# Patient Record
Sex: Female | Born: 1970 | Race: Black or African American | Hispanic: No | Marital: Married | Smoking: Never smoker
Health system: Southern US, Community
[De-identification: ages and names within clinical notes are randomized; demographics above are authoritative.]

## PROBLEM LIST (undated history)

## (undated) DIAGNOSIS — Z789 Other specified health status: Secondary | ICD-10-CM

## (undated) HISTORY — PX: NO PAST SURGERIES: SHX2092

---

## 2007-03-08 ENCOUNTER — Other Ambulatory Visit: Admission: RE | Admit: 2007-03-08 | Discharge: 2007-03-08 | Payer: Self-pay | Admitting: Obstetrics and Gynecology

## 2010-11-09 ENCOUNTER — Emergency Department (HOSPITAL_COMMUNITY)
Admission: EM | Admit: 2010-11-09 | Discharge: 2010-11-10 | Disposition: A | Discharge status: Home / Self Care | Payer: Self-pay | Attending: Emergency Medicine | Admitting: Emergency Medicine

## 2010-11-09 DIAGNOSIS — R110 Nausea: Secondary | ICD-10-CM | POA: Insufficient documentation

## 2010-11-09 DIAGNOSIS — K219 Gastro-esophageal reflux disease without esophagitis: Secondary | ICD-10-CM | POA: Insufficient documentation

## 2010-11-09 DIAGNOSIS — R1013 Epigastric pain: Secondary | ICD-10-CM | POA: Insufficient documentation

## 2010-11-09 DIAGNOSIS — I10 Essential (primary) hypertension: Secondary | ICD-10-CM | POA: Insufficient documentation

## 2010-11-10 LAB — POCT I-STAT, CHEM 8
Chloride: 106 mEq/L (ref 96–112)
HCT: 39 % (ref 36.0–46.0)
Potassium: 3.9 mEq/L (ref 3.5–5.1)
Sodium: 141 mEq/L (ref 135–145)

## 2010-11-10 LAB — URINALYSIS, ROUTINE W REFLEX MICROSCOPIC
Nitrite: NEGATIVE
Specific Gravity, Urine: 1.008 (ref 1.005–1.030)
pH: 6.5 (ref 5.0–8.0)

## 2010-11-10 LAB — POCT PREGNANCY, URINE: Preg Test, Ur: NEGATIVE

## 2012-06-08 NOTE — L&D Delivery Note (Signed)
Delivery Note At 4:33 PM a viable female was delivered via Vaginal, Spontaneous Delivery (Presentation: Occiput Anterior).  APGAR: 9, 9 ; weight pending.  Infant placed directly on mother's chest. Placenta status: intact, spontaneous.  Cord: 3 vessels with no complications.  Cord pH: not sent. Mother and infant bonding.  Anesthesia: None  Episiotomy: None Lacerations: 1st Degree  Suture Repair: 3.0 monocryl Est. Blood Loss (mL): 400  Mom to postpartum.  Baby to nursery-stable.  Selena Lesser 04/04/2013, 4:50 PM

## 2012-06-08 NOTE — L&D Delivery Note (Signed)
I was present for delivery and agree with note above. MUHAMMAD,Davinia Riccardi  

## 2012-12-21 ENCOUNTER — Other Ambulatory Visit (HOSPITAL_COMMUNITY)
Admission: RE | Admit: 2012-12-21 | Discharge: 2012-12-21 | Disposition: A | Discharge status: Home / Self Care | Payer: Medicaid Other | Source: Ambulatory Visit | Attending: Obstetrics and Gynecology | Admitting: Obstetrics and Gynecology

## 2012-12-21 ENCOUNTER — Encounter: Payer: Self-pay | Admitting: Obstetrics and Gynecology

## 2012-12-21 ENCOUNTER — Ambulatory Visit (INDEPENDENT_AMBULATORY_CARE_PROVIDER_SITE_OTHER): Payer: Medicaid Other | Admitting: Obstetrics and Gynecology

## 2012-12-21 VITALS — BP 100/68 | Temp 97.1°F | Ht 63.0 in | Wt 148.2 lb

## 2012-12-21 DIAGNOSIS — Z113 Encounter for screening for infections with a predominantly sexual mode of transmission: Secondary | ICD-10-CM | POA: Insufficient documentation

## 2012-12-21 DIAGNOSIS — Z3482 Encounter for supervision of other normal pregnancy, second trimester: Secondary | ICD-10-CM

## 2012-12-21 DIAGNOSIS — O0932 Supervision of pregnancy with insufficient antenatal care, second trimester: Secondary | ICD-10-CM

## 2012-12-21 DIAGNOSIS — Z01419 Encounter for gynecological examination (general) (routine) without abnormal findings: Secondary | ICD-10-CM | POA: Insufficient documentation

## 2012-12-21 DIAGNOSIS — O0930 Supervision of pregnancy with insufficient antenatal care, unspecified trimester: Secondary | ICD-10-CM

## 2012-12-21 DIAGNOSIS — Z1151 Encounter for screening for human papillomavirus (HPV): Secondary | ICD-10-CM | POA: Insufficient documentation

## 2012-12-21 DIAGNOSIS — Z3480 Encounter for supervision of other normal pregnancy, unspecified trimester: Secondary | ICD-10-CM | POA: Insufficient documentation

## 2012-12-21 LAB — POCT URINALYSIS DIP (DEVICE)
Bilirubin Urine: NEGATIVE
Glucose, UA: NEGATIVE mg/dL
Leukocytes, UA: NEGATIVE
Nitrite: NEGATIVE
Urobilinogen, UA: 0.2 mg/dL (ref 0.0–1.0)

## 2012-12-21 NOTE — Assessment & Plan Note (Signed)
See Initial OB Visit Note for details.

## 2012-12-21 NOTE — Progress Notes (Signed)
Pulse- 89 Patient reports occasional vaginal itching; patient not 100 % sure about LMP but knows it was sometime in January

## 2012-12-21 NOTE — Progress Notes (Signed)
Evaluation and management procedures were performed by Resident physician under my supervision/collaboration. Chart reviewed, patient examined by me and I agree with management and plan.  

## 2012-12-21 NOTE — Progress Notes (Addendum)
   Subjective:    Rachael Gardner is a [id number] [redacted]w[redacted]d being seen today for her first obstetrical visit, without prior prenatal care. She has taken a Prenatal MVI since she tested + for pregnancy.  Her obstetrical history is not significant for any reported complications. Patient does intend to breast feed. Pregnancy history fully reviewed.  Patient reports no complaints.  Filed Vitals:   12/21/12 0827 12/21/12 0829  BP: 100/68   Temp: 97.1 F (36.2 C)   Height:  5\' 3"  (1.6 m)  Weight: 148 lb 3.2 oz (67.223 kg)     HISTORY: OB History   Grav Para Term Preterm Abortions TAB SAB Ect Mult Living   5 4 4  0 0 0 0 0 0 4     # Outc Date GA Lbr Len/2nd Wgt Sex Del Anes PTL Lv   1 TRM 3/91 [redacted]w[redacted]d  7lb(3.175kg) M SVD   Yes   2 TRM 8/93 [redacted]w[redacted]d  9lb(4.082kg) M SVD   Yes   3 TRM 9/96 [redacted]w[redacted]d  7lb(3.175kg) M SVD None  Yes   4 TRM 9/99 [redacted]w[redacted]d  4lb(1.814kg) F SVD None  Yes   5 CUR              History reviewed. No pertinent past medical history. History reviewed. No pertinent past surgical history. History reviewed. No pertinent family history.   Exam    Uterus:     Pelvic Exam:    Perineum: Normal Perineum   Vulva: normal   Vagina:  normal discharge, wet prep done   pH:    Cervix: no bleeding following Pap, no cervical motion tenderness and no lesions   Adnexa: not evaluated      System: Breast:  normal appearance, no masses or tenderness, Inspection negative   Skin: normal coloration and turgor, no rashes    Neurologic: oriented, gait normal; reflexes normal and symmetric, grossly non-focal   Extremities: normal strength, tone, and muscle mass, no erythema, induration, or nodules   HEENT PERRLA and oropharynx clear, no lesions   Mouth/Teeth mucous membranes moist, pharynx normal without lesions   Neck Supple, no masses, no LAD   Cardiovascular: regular rate and rhythm   Respiratory:  chest clear, no wheezing, crepitations, rhonchi, normal symmetric air entry   Abdomen: soft,  non-tender; bowel sounds normal; no masses,  no organomegaly, normal pregnant abd   Urinary: urethral meatus normal      Assessment:    Pregnancy: [id number] Patient Active Problem List   Diagnosis Date Noted  . Supervision of normal subsequent pregnancy 12/21/2012        Plan:     Initial labs drawn. Continues to take Prenatal vitamins. Problem list reviewed and updated. Presented too late for genetic screening.  No prior US, Ultrasound discussed will order for next apt.  Follow up in 4 weeks. 50% of 30 min visit spent on counseling and coordination of care.    Saralyn Pilar 12/21/2012  Evaluation and management procedures were performed by Resident physician under my supervision/collaboration. Chart reviewed, patient examined by me and I agree with management and plan.

## 2012-12-22 LAB — OBSTETRIC PANEL
Basophils Relative: 0 % (ref 0–1)
Eosinophils Absolute: 0 10*3/uL (ref 0.0–0.7)
Hemoglobin: 10.9 g/dL — ABNORMAL LOW (ref 12.0–15.0)
Hepatitis B Surface Ag: NEGATIVE
Lymphs Abs: 1.1 10*3/uL (ref 0.7–4.0)
MCH: 30.4 pg (ref 26.0–34.0)
MCHC: 33.7 g/dL (ref 30.0–36.0)
Monocytes Relative: 6 % (ref 3–12)
Neutro Abs: 5 10*3/uL (ref 1.7–7.7)
Neutrophils Relative %: 77 % (ref 43–77)
Platelets: 169 10*3/uL (ref 150–400)
RBC: 3.58 MIL/uL — ABNORMAL LOW (ref 3.87–5.11)
Rh Type: POSITIVE

## 2012-12-22 LAB — CULTURE, OB URINE: Colony Count: NO GROWTH

## 2012-12-26 LAB — HEMOGLOBINOPATHY EVALUATION
Hemoglobin Other: 0 %
Hgb A2 Quant: 2.9 % (ref 2.2–3.2)
Hgb A: 97.1 % (ref 96.8–97.8)
Hgb S Quant: 0 %

## 2013-01-04 ENCOUNTER — Ambulatory Visit (INDEPENDENT_AMBULATORY_CARE_PROVIDER_SITE_OTHER): Payer: Medicaid Other | Admitting: Family

## 2013-01-04 VITALS — BP 108/74 | Temp 98.8°F | Wt 147.8 lb

## 2013-01-04 DIAGNOSIS — Z3480 Encounter for supervision of other normal pregnancy, unspecified trimester: Secondary | ICD-10-CM

## 2013-01-04 DIAGNOSIS — O0930 Supervision of pregnancy with insufficient antenatal care, unspecified trimester: Secondary | ICD-10-CM

## 2013-01-04 LAB — POCT URINALYSIS DIP (DEVICE)
Hgb urine dipstick: NEGATIVE
Nitrite: NEGATIVE
Protein, ur: 30 mg/dL — AB
Specific Gravity, Urine: >=1.03 (ref 1.005–1.030)
Urobilinogen, UA: 0.2 mg/dL (ref 0.0–1.0)
pH: 5.5 (ref 5.0–8.0)

## 2013-01-04 NOTE — Progress Notes (Signed)
No concerns today. Presented late for prenatal care, no genetic screen or prior US. Denies vaginal bleeding / discharge / fluid leakage. No burning on urination, nausea, or pains / contractions. +Fetal movement daily.  Had early 1hr OGT normal. No need to repeat. UA shows Protein 30 (1+), BP normal, no edema, or prior elevated Uprotein. Will follow at next visit. Schedule for initial anatomy US before next apt Return for follow-up for pregnancy supervision in 2 weeks.

## 2013-01-04 NOTE — Patient Instructions (Signed)
Pregnancy - Second Trimester The second trimester of pregnancy (3 to 6 months) is a period of rapid growth for you and your baby. At the end of the sixth month, your baby is about 9 inches long and weighs 1 1/2 pounds. You will begin to feel the baby move between 18 and 20 weeks of the pregnancy. This is called quickening. Weight gain is faster. A clear fluid (colostrum) may leak out of your breasts. You may feel small contractions of the womb (uterus). This is known as false labor or Braxton-Hicks contractions. This is like a practice for labor when the baby is ready to be born. Usually, the problems with morning sickness have usually passed by the end of your first trimester. Some women develop small dark blotches (called cholasma, mask of pregnancy) on their face that usually goes away after the baby is born. Exposure to the sun makes the blotches worse. Acne may also develop in some pregnant women and pregnant women who have acne, may find that it goes away. PRENATAL EXAMS  Blood work may continue to be done during prenatal exams. These tests are done to check on your health and the probable health of your baby. Blood work is used to follow your blood levels (hemoglobin). Anemia (low hemoglobin) is common during pregnancy. Iron and vitamins are given to help prevent this. You will also be checked for diabetes between 24 and 28 weeks of the pregnancy. Some of the previous blood tests may be repeated.  The size of the uterus is measured during each visit. This is to make sure that the baby is continuing to grow properly according to the dates of the pregnancy.  Your blood pressure is checked every prenatal visit. This is to make sure you are not getting toxemia.  Your urine is checked to make sure you do not have an infection, diabetes or protein in the urine.  Your weight is checked often to make sure gains are happening at the suggested rate. This is to ensure that both you and your baby are  growing normally.  Sometimes, an ultrasound is performed to confirm the proper growth and development of the baby. This is a test which bounces harmless sound waves off the baby so your caregiver can more accurately determine due dates. Sometimes, a test is done on the amniotic fluid surrounding the baby. This test is called an amniocentesis. The amniotic fluid is obtained by sticking a needle into the belly (abdomen). This is done to check the chromosomes in instances where there is a concern about possible genetic problems with the baby. It is also sometimes done near the end of pregnancy if an early delivery is required. In this case, it is done to help make sure the baby's lungs are mature enough for the baby to live outside of the womb. CHANGES OCCURING IN THE SECOND TRIMESTER OF PREGNANCY Your body goes through many changes during pregnancy. They vary from person to person. Talk to your caregiver about changes you notice that you are concerned about.  During the second trimester, you will likely have an increase in your appetite. It is normal to have cravings for certain foods. This varies from person to person and pregnancy to pregnancy.  Your lower abdomen will begin to bulge.  You may have to urinate more often because the uterus and baby are pressing on your bladder. It is also common to get more bladder infections during pregnancy. You can help this by drinking lots of fluids   and emptying your bladder before and after intercourse.  You may begin to get stretch marks on your hips, abdomen, and breasts. These are normal changes in the body during pregnancy. There are no exercises or medicines to take that prevent this change.  You may begin to develop swollen and bulging veins (varicose veins) in your legs. Wearing support hose, elevating your feet for 15 minutes, 3 to 4 times a day and limiting salt in your diet helps lessen the problem.  Heartburn may develop as the uterus grows and  pushes up against the stomach. Antacids recommended by your caregiver helps with this problem. Also, eating smaller meals 4 to 5 times a day helps.  Constipation can be treated with a stool softener or adding bulk to your diet. Drinking lots of fluids, and eating vegetables, fruits, and whole grains are helpful.  Exercising is also helpful. If you have been very active up until your pregnancy, most of these activities can be continued during your pregnancy. If you have been less active, it is helpful to start an exercise program such as walking.  Hemorrhoids may develop at the end of the second trimester. Warm sitz baths and hemorrhoid cream recommended by your caregiver helps hemorrhoid problems.  Backaches may develop during this time of your pregnancy. Avoid heavy lifting, wear low heal shoes, and practice good posture to help with backache problems.  Some pregnant women develop tingling and numbness of their hand and fingers because of swelling and tightening of ligaments in the wrist (carpel tunnel syndrome). This goes away after the baby is born.  As your breasts enlarge, you may have to get a bigger bra. Get a comfortable, cotton, support bra. Do not get a nursing bra until the last month of the pregnancy if you will be nursing the baby.  You may get a dark line from your belly button to the pubic area called the linea nigra.  You may develop rosy cheeks because of increase blood flow to the face.  You may develop spider looking lines of the face, neck, arms, and chest. These go away after the baby is born. HOME CARE INSTRUCTIONS   It is extremely important to avoid all smoking, herbs, alcohol, and unprescribed drugs during your pregnancy. These chemicals affect the formation and growth of the baby. Avoid these chemicals throughout the pregnancy to ensure the delivery of a healthy infant.  Most of your home care instructions are the same as suggested for the first trimester of your  pregnancy. Keep your caregiver's appointments. Follow your caregiver's instructions regarding medicine use, exercise, and diet.  During pregnancy, you are providing food for you and your baby. Continue to eat regular, well-balanced meals. Choose foods such as meat, fish, milk and other low fat dairy products, vegetables, fruits, and whole-grain breads and cereals. Your caregiver will tell you of the ideal weight gain.  A physical sexual relationship may be continued up until near the end of pregnancy if there are no other problems. Problems could include early (premature) leaking of amniotic fluid from the membranes, vaginal bleeding, abdominal pain, or other medical or pregnancy problems.  Exercise regularly if there are no restrictions. Check with your caregiver if you are unsure of the safety of some of your exercises. The greatest weight gain will occur in the last 2 trimesters of pregnancy. Exercise will help you:  Control your weight.  Get you in shape for labor and delivery.  Lose weight after you have the baby.  Wear   a good support or jogging bra for breast tenderness during pregnancy. This may help if worn during sleep. Pads or tissues may be used in the bra if you are leaking colostrum.  Do not use hot tubs, steam rooms or saunas throughout the pregnancy.  Wear your seat belt at all times when driving. This protects you and your baby if you are in an accident.  Avoid raw meat, uncooked cheese, cat litter boxes, and soil used by cats. These carry germs that can cause birth defects in the baby.  The second trimester is also a good time to visit your dentist for your dental health if this has not been done yet. Getting your teeth cleaned is okay. Use a soft toothbrush. Brush gently during pregnancy.  It is easier to leak urine during pregnancy. Tightening up and strengthening the pelvic muscles will help with this problem. Practice stopping your urination while you are going to the  bathroom. These are the same muscles you need to strengthen. It is also the muscles you would use as if you were trying to stop from passing gas. You can practice tightening these muscles up 10 times a set and repeating this about 3 times per day. Once you know what muscles to tighten up, do not perform these exercises during urination. It is more likely to contribute to an infection by backing up the urine.  Ask for help if you have financial, counseling, or nutritional needs during pregnancy. Your caregiver will be able to offer counseling for these needs as well as refer you for other special needs.  Your skin may become oily. If so, wash your face with mild soap, use non-greasy moisturizer and oil or cream based makeup. MEDICINES AND DRUG USE IN PREGNANCY  Take prenatal vitamins as directed. The vitamin should contain 1 milligram of folic acid. Keep all vitamins out of reach of children. Only a couple vitamins or tablets containing iron may be fatal to a baby or young child when ingested.  Avoid use of all medicines, including herbs, over-the-counter medicines, not prescribed or suggested by your caregiver. Only take over-the-counter or prescription medicines for pain, discomfort, or fever as directed by your caregiver. Do not use aspirin.  Let your caregiver also know about herbs you may be using.  Alcohol is related to a number of birth defects. This includes fetal alcohol syndrome. All alcohol, in any form, should be avoided completely. Smoking will cause low birth rate and premature babies.  Street or illegal drugs are very harmful to the baby. They are absolutely forbidden. A baby born to an addicted mother will be addicted at birth. The baby will go through the same withdrawal an adult does. SEEK MEDICAL CARE IF:  You have any concerns or worries during your pregnancy. It is better to call with your questions if you feel they cannot wait, rather than worry about them. SEEK IMMEDIATE  MEDICAL CARE IF:   An unexplained oral temperature above 102 F (38.9 C) develops, or as your caregiver suggests.  You have leaking of fluid from the vagina (birth canal). If leaking membranes are suspected, take your temperature and tell your caregiver of this when you call.  There is vaginal spotting, bleeding, or passing clots. Tell your caregiver of the amount and how many pads are used. Light spotting in pregnancy is common, especially following intercourse.  You develop a bad smelling vaginal discharge with a change in the color from clear to white.  You continue to feel   sick to your stomach (nauseated) and have no relief from remedies suggested. You vomit blood or coffee ground-like materials.  You lose more than 2 pounds of weight or gain more than 2 pounds of weight over 1 week, or as suggested by your caregiver.  You notice swelling of your face, hands, feet, or legs.  You get exposed to German measles and have never had them.  You are exposed to fifth disease or chickenpox.  You develop belly (abdominal) pain. Round ligament discomfort is a common non-cancerous (benign) cause of abdominal pain in pregnancy. Your caregiver still must evaluate you.  You develop a bad headache that does not go away.  You develop fever, diarrhea, pain with urination, or shortness of breath.  You develop visual problems, blurry, or double vision.  You fall or are in a car accident or any kind of trauma.  There is mental or physical violence at home. Document Released: 05/19/2001 Document Revised: 02/17/2012 Document Reviewed: 11/21/2008 [employer] Patient Information 2014 [employer].  

## 2013-01-04 NOTE — Assessment & Plan Note (Signed)
See Prenatal Vitals and Notes for details. 

## 2013-01-04 NOTE — Progress Notes (Signed)
Pulse- 101 

## 2013-01-10 ENCOUNTER — Ambulatory Visit (HOSPITAL_COMMUNITY)
Admission: RE | Admit: 2013-01-10 | Discharge: 2013-01-10 | Disposition: A | Discharge status: Home / Self Care | Payer: No Typology Code available for payment source | Source: Ambulatory Visit | Attending: Obstetrics and Gynecology | Admitting: Obstetrics and Gynecology

## 2013-01-10 DIAGNOSIS — O0932 Supervision of pregnancy with insufficient antenatal care, second trimester: Secondary | ICD-10-CM

## 2013-01-10 DIAGNOSIS — O09529 Supervision of elderly multigravida, unspecified trimester: Secondary | ICD-10-CM | POA: Insufficient documentation

## 2013-01-10 DIAGNOSIS — O0930 Supervision of pregnancy with insufficient antenatal care, unspecified trimester: Secondary | ICD-10-CM | POA: Insufficient documentation

## 2013-01-10 DIAGNOSIS — Z3482 Encounter for supervision of other normal pregnancy, second trimester: Secondary | ICD-10-CM

## 2013-01-10 DIAGNOSIS — Z3689 Encounter for other specified antenatal screening: Secondary | ICD-10-CM | POA: Insufficient documentation

## 2013-01-18 ENCOUNTER — Ambulatory Visit (INDEPENDENT_AMBULATORY_CARE_PROVIDER_SITE_OTHER): Payer: Medicaid Other | Admitting: Family Medicine

## 2013-01-18 ENCOUNTER — Encounter: Payer: Self-pay | Admitting: Family Medicine

## 2013-01-18 VITALS — BP 103/62 | Temp 97.1°F | Wt 152.0 lb

## 2013-01-18 DIAGNOSIS — O0932 Supervision of pregnancy with insufficient antenatal care, second trimester: Secondary | ICD-10-CM

## 2013-01-18 DIAGNOSIS — O0930 Supervision of pregnancy with insufficient antenatal care, unspecified trimester: Secondary | ICD-10-CM

## 2013-01-18 DIAGNOSIS — Z3483 Encounter for supervision of other normal pregnancy, third trimester: Secondary | ICD-10-CM

## 2013-01-18 LAB — POCT URINALYSIS DIP (DEVICE)
Hgb urine dipstick: NEGATIVE
Ketones, ur: NEGATIVE mg/dL
Protein, ur: NEGATIVE mg/dL
Specific Gravity, Urine: 1.02 (ref 1.005–1.030)
pH: 7 (ref 5.0–8.0)

## 2013-01-18 NOTE — Progress Notes (Signed)
  Subjective:    Rachael Gardner is a 42 y.o. female being seen today for her obstetrical visit. She is at [redacted]w[redacted]d gestation. Patient reports no bleeding, no contractions, no cramping and no leaking. Fetal movement: normal.  Menstrual History: OB History   Grav Para Term Preterm Abortions TAB SAB Ect Mult Living   5 4 4  0 0 0 0 0 0 4      Patient's last menstrual period was 06/17/2012.    The following portions of the patient's history were reviewed and updated as appropriate: allergies, current medications, past family history, past medical history, past social history, past surgical history and problem list.  Review of Systems Pertinent items are noted in HPI.   Objective:    BP 103/62  Temp(Src) 97.1 F (36.2 C)  Wt 152 lb (68.947 kg)  BMI 26.93 kg/m2  LMP 06/17/2012 FHT:  154 BPM  Uterine Size: 28 cm  Presentation: unsure     Assessment:  Rachael Gardner is a 42 y.o. G5P4004 at [redacted]w[redacted]d ROB Plan:   Rachael Gardner is a 42 y.o. [id number] at [redacted]w[redacted]d  here for ROB visit.  Discussed with Patient:  -Plans to breast feed.  All questions answered. -Continue prenatal vitamins. - no quad screen late to care -Reviewed fetal kick counts (Pt to perform daily at a time when the baby is active, lie laterally with both hands on belly in quiet room and count all movements (hiccups, shoulder rolls, obvious kicks, etc); pt is to report to clinic or MAU for less than 10 movements felt in a one hour time period-pt told as soon as she counts 10 movements the count is complete.)  - Routine precautions discussed (depression, infection s/s).   Patient provided with all pertinent phone numbers for emergencies. - RTC for any VB, regular, painful cramps/ctxs occurring at a rate of >2/10 min, fever (100.5 or higher), n/v/d, any pain that is unresolving or worsening, LOF, decreased fetal movement, CP, SOB, edema  Problems: Patient Active Problem List   Diagnosis Date Noted  . Supervision of normal  subsequent pregnancy 12/21/2012  . Insufficient prenatal care in second trimester 12/21/2012    To Do: -desires tubal consent today - neg glucola - repeat growth [place] to confirm dating vs SGA  Edu: [x ] PTL precautions; Tawana Scale, MD OB Fellow

## 2013-01-18 NOTE — Progress Notes (Signed)
Pulse: 92

## 2013-01-18 NOTE — Patient Instructions (Signed)

## 2013-01-24 ENCOUNTER — Ambulatory Visit (HOSPITAL_COMMUNITY): Admission: RE | Admit: 2013-01-24 | Payer: Medicaid Other | Source: Ambulatory Visit

## 2013-02-03 ENCOUNTER — Encounter: Payer: Self-pay | Admitting: *Deleted

## 2013-02-07 ENCOUNTER — Ambulatory Visit (HOSPITAL_COMMUNITY)
Admission: RE | Admit: 2013-02-07 | Discharge: 2013-02-07 | Disposition: A | Discharge status: Home / Self Care | Payer: No Typology Code available for payment source | Source: Ambulatory Visit | Attending: Family Medicine | Admitting: Family Medicine

## 2013-02-07 ENCOUNTER — Other Ambulatory Visit: Payer: Self-pay | Admitting: Family Medicine

## 2013-02-07 DIAGNOSIS — Z3480 Encounter for supervision of other normal pregnancy, unspecified trimester: Secondary | ICD-10-CM | POA: Insufficient documentation

## 2013-02-07 DIAGNOSIS — Z3483 Encounter for supervision of other normal pregnancy, third trimester: Secondary | ICD-10-CM

## 2013-02-07 NOTE — Progress Notes (Signed)
Rachael Gardner  was seen today for an ultrasound appointment.  See full report in [facility].  Impression: Single IUP at 31 2/7 weeks Normal interval anatomy Fetal growth is appropriate (72nd %tile) Normal amniotic fluid volume  Recommendations: Follow-up ultrasounds as clinically indicated.   Alpha Gula, MD

## 2013-02-15 ENCOUNTER — Ambulatory Visit (INDEPENDENT_AMBULATORY_CARE_PROVIDER_SITE_OTHER): Payer: No Typology Code available for payment source | Admitting: Family

## 2013-02-15 VITALS — BP 108/75 | Wt 154.4 lb

## 2013-02-15 DIAGNOSIS — O0930 Supervision of pregnancy with insufficient antenatal care, unspecified trimester: Secondary | ICD-10-CM

## 2013-02-15 DIAGNOSIS — O0932 Supervision of pregnancy with insufficient antenatal care, second trimester: Secondary | ICD-10-CM

## 2013-02-15 LAB — POCT URINALYSIS DIP (DEVICE)
Hgb urine dipstick: NEGATIVE
Protein, ur: NEGATIVE mg/dL
Specific Gravity, Urine: 1.02 (ref 1.005–1.030)
Urobilinogen, UA: 0.2 mg/dL (ref 0.0–1.0)

## 2013-02-15 NOTE — Progress Notes (Signed)
Pulse: 89

## 2013-02-15 NOTE — Progress Notes (Signed)
No questions or concerns.  Reviewed [place] results and dating.

## 2013-03-01 ENCOUNTER — Ambulatory Visit (INDEPENDENT_AMBULATORY_CARE_PROVIDER_SITE_OTHER): Payer: No Typology Code available for payment source | Admitting: Family

## 2013-03-01 VITALS — BP 104/70 | Temp 98.0°F | Wt 155.0 lb

## 2013-03-01 DIAGNOSIS — O0933 Supervision of pregnancy with insufficient antenatal care, third trimester: Secondary | ICD-10-CM

## 2013-03-01 DIAGNOSIS — O0930 Supervision of pregnancy with insufficient antenatal care, unspecified trimester: Secondary | ICD-10-CM

## 2013-03-01 LAB — POCT URINALYSIS DIP (DEVICE)
Bilirubin Urine: NEGATIVE
Hgb urine dipstick: NEGATIVE
Nitrite: NEGATIVE
Urobilinogen, UA: 0.2 mg/dL (ref 0.0–1.0)
pH: 7 (ref 5.0–8.0)

## 2013-03-01 NOTE — Progress Notes (Signed)
No questions or concerns.  Reviewed plans for GBS/GC-CT at next visit.

## 2013-03-01 NOTE — Progress Notes (Signed)
Pulse: 88

## 2013-03-09 ENCOUNTER — Encounter: Payer: Self-pay | Admitting: *Deleted

## 2013-03-15 ENCOUNTER — Encounter: Payer: Self-pay | Admitting: Family Medicine

## 2013-03-15 ENCOUNTER — Ambulatory Visit (INDEPENDENT_AMBULATORY_CARE_PROVIDER_SITE_OTHER): Payer: No Typology Code available for payment source | Admitting: Family Medicine

## 2013-03-15 VITALS — BP 107/71 | Temp 98.8°F | Wt 156.0 lb

## 2013-03-15 DIAGNOSIS — O0930 Supervision of pregnancy with insufficient antenatal care, unspecified trimester: Secondary | ICD-10-CM

## 2013-03-15 DIAGNOSIS — O0932 Supervision of pregnancy with insufficient antenatal care, second trimester: Secondary | ICD-10-CM

## 2013-03-15 LAB — OB RESULTS CONSOLE GC/CHLAMYDIA: Gonorrhea: NEGATIVE

## 2013-03-15 LAB — OB RESULTS CONSOLE GBS: GBS: NEGATIVE

## 2013-03-15 NOTE — Progress Notes (Signed)
Pulse- 94 

## 2013-03-15 NOTE — Patient Instructions (Signed)

## 2013-03-15 NOTE — Progress Notes (Signed)
No concerns today. Doing well. No ctx, vb, lof. +FM  A/P  36 week cultures and gbs done today  Labor precautions discussed - Tdap already done  - flu shot today

## 2013-03-16 LAB — GC/CHLAMYDIA PROBE AMP
CT Probe RNA: NEGATIVE
GC Probe RNA: NEGATIVE

## 2013-03-18 LAB — CULTURE, BETA STREP (GROUP B ONLY)

## 2013-03-19 ENCOUNTER — Encounter: Payer: Self-pay | Admitting: Family Medicine

## 2013-03-22 ENCOUNTER — Ambulatory Visit (INDEPENDENT_AMBULATORY_CARE_PROVIDER_SITE_OTHER): Payer: No Typology Code available for payment source | Admitting: Advanced Practice Midwife

## 2013-03-22 VITALS — BP 110/77 | Wt 157.7 lb

## 2013-03-22 DIAGNOSIS — O0932 Supervision of pregnancy with insufficient antenatal care, second trimester: Secondary | ICD-10-CM

## 2013-03-22 DIAGNOSIS — O0930 Supervision of pregnancy with insufficient antenatal care, unspecified trimester: Secondary | ICD-10-CM

## 2013-03-22 LAB — POCT URINALYSIS DIP (DEVICE)
Ketones, ur: NEGATIVE mg/dL
Protein, ur: NEGATIVE mg/dL
Specific Gravity, Urine: 1.02 (ref 1.005–1.030)

## 2013-03-22 NOTE — Progress Notes (Signed)
Pulse: 88

## 2013-03-22 NOTE — Progress Notes (Signed)
Doing well.  Good fetal movement, denies vaginal bleeding, LOF, regular contractions.  

## 2013-03-29 ENCOUNTER — Ambulatory Visit (INDEPENDENT_AMBULATORY_CARE_PROVIDER_SITE_OTHER): Payer: No Typology Code available for payment source | Admitting: Obstetrics and Gynecology

## 2013-03-29 VITALS — BP 116/80 | Wt 156.7 lb

## 2013-03-29 DIAGNOSIS — O09529 Supervision of elderly multigravida, unspecified trimester: Secondary | ICD-10-CM

## 2013-03-29 DIAGNOSIS — O0930 Supervision of pregnancy with insufficient antenatal care, unspecified trimester: Secondary | ICD-10-CM

## 2013-03-29 DIAGNOSIS — O0932 Supervision of pregnancy with insufficient antenatal care, second trimester: Secondary | ICD-10-CM

## 2013-03-29 DIAGNOSIS — O09523 Supervision of elderly multigravida, third trimester: Secondary | ICD-10-CM

## 2013-03-29 LAB — POCT URINALYSIS DIP (DEVICE)
Glucose, UA: NEGATIVE mg/dL
Hgb urine dipstick: NEGATIVE
Ketones, ur: NEGATIVE mg/dL
Specific Gravity, Urine: 1.02 (ref 1.005–1.030)
Urobilinogen, UA: 0.2 mg/dL (ref 0.0–1.0)

## 2013-03-29 NOTE — Progress Notes (Signed)
Pulse- 87 

## 2013-03-29 NOTE — Progress Notes (Signed)
Doing well. Youngest child is 40 yo. Plans PPS, breast

## 2013-04-04 ENCOUNTER — Inpatient Hospital Stay (HOSPITAL_COMMUNITY)
Admission: AD | Admit: 2013-04-04 | Discharge: 2013-04-06 | DRG: 767 | Disposition: A | Discharge status: Home / Self Care | Payer: No Typology Code available for payment source | Source: Ambulatory Visit | Attending: Obstetrics & Gynecology | Admitting: Obstetrics & Gynecology

## 2013-04-04 ENCOUNTER — Encounter (HOSPITAL_COMMUNITY): Payer: Self-pay | Admitting: *Deleted

## 2013-04-04 DIAGNOSIS — Z302 Encounter for sterilization: Secondary | ICD-10-CM

## 2013-04-04 HISTORY — DX: Other specified health status: Z78.9

## 2013-04-04 LAB — CBC
Hemoglobin: 12 g/dL (ref 12.0–15.0)
MCH: 30.2 pg (ref 26.0–34.0)
MCV: 87.9 fL (ref 78.0–100.0)
RBC: 3.98 MIL/uL (ref 3.87–5.11)

## 2013-04-04 LAB — POCT FERN TEST: POCT Fern Test: POSITIVE

## 2013-04-04 MED ORDER — LACTATED RINGERS IV SOLN: 500.0000 mL | INTRAVENOUS | Status: DC | PRN

## 2013-04-04 MED ORDER — BUTORPHANOL TARTRATE 1 MG/ML IJ SOLN: 1.0000 mg | INTRAMUSCULAR | Status: DC | PRN

## 2013-04-04 MED ORDER — ONDANSETRON HCL 4 MG/2ML IJ SOLN: 4.0000 mg | INTRAMUSCULAR | Status: DC | PRN

## 2013-04-04 MED ORDER — OXYTOCIN 40 UNITS IN LACTATED RINGERS INFUSION - SIMPLE MED
62.5000 mL/h | INTRAVENOUS | Status: DC
  Administered 2013-04-04: 62.5 mL/h via INTRAVENOUS

## 2013-04-04 MED ORDER — ONDANSETRON HCL 4 MG/2ML IJ SOLN: 4.0000 mg | Freq: Four times a day (QID) | INTRAMUSCULAR | Status: DC | PRN

## 2013-04-04 MED ORDER — LIDOCAINE HCL (PF) 1 % IJ SOLN
30.0000 mL | INTRAMUSCULAR | Status: DC | PRN
  Filled 2013-04-04 (×2): qty 30

## 2013-04-04 MED ORDER — DIBUCAINE 1 % RE OINT
1.0000 "application " | TOPICAL_OINTMENT | RECTAL | Status: DC | PRN
  Administered 2013-04-05: 1 via RECTAL
  Filled 2013-04-04: qty 28

## 2013-04-04 MED ORDER — OXYCODONE-ACETAMINOPHEN 5-325 MG PO TABS: 1.0000 | ORAL_TABLET | ORAL | Status: DC | PRN

## 2013-04-04 MED ORDER — OXYTOCIN BOLUS FROM INFUSION: 500.0000 mL | INTRAVENOUS | Status: DC

## 2013-04-04 MED ORDER — IBUPROFEN 600 MG PO TABS: 600.0000 mg | ORAL_TABLET | Freq: Four times a day (QID) | ORAL | Status: DC | PRN

## 2013-04-04 MED ORDER — FAMOTIDINE 20 MG PO TABS
40.0000 mg | ORAL_TABLET | Freq: Once | ORAL | Status: AC
  Administered 2013-04-05: 40 mg via ORAL
  Filled 2013-04-04: qty 2

## 2013-04-04 MED ORDER — SENNOSIDES-DOCUSATE SODIUM 8.6-50 MG PO TABS
2.0000 | ORAL_TABLET | ORAL | Status: DC
  Administered 2013-04-05 – 2013-04-06 (×2): 2 via ORAL
  Filled 2013-04-04 (×2): qty 2

## 2013-04-04 MED ORDER — OXYTOCIN 40 UNITS IN LACTATED RINGERS INFUSION - SIMPLE MED
1.0000 m[IU]/min | INTRAVENOUS | Status: DC
  Administered 2013-04-04: 2 m[IU]/min via INTRAVENOUS
  Filled 2013-04-04: qty 1000

## 2013-04-04 MED ORDER — DIPHENHYDRAMINE HCL 25 MG PO CAPS: 25.0000 mg | ORAL_CAPSULE | Freq: Four times a day (QID) | ORAL | Status: DC | PRN

## 2013-04-04 MED ORDER — IBUPROFEN 600 MG PO TABS
600.0000 mg | ORAL_TABLET | Freq: Four times a day (QID) | ORAL | Status: DC
  Administered 2013-04-04 – 2013-04-06 (×7): 600 mg via ORAL
  Filled 2013-04-04 (×7): qty 1

## 2013-04-04 MED ORDER — ZOLPIDEM TARTRATE 5 MG PO TABS: 5.0000 mg | ORAL_TABLET | Freq: Every evening | ORAL | Status: DC | PRN

## 2013-04-04 MED ORDER — LANOLIN HYDROUS EX OINT: TOPICAL_OINTMENT | CUTANEOUS | Status: DC | PRN

## 2013-04-04 MED ORDER — CITRIC ACID-SODIUM CITRATE 334-500 MG/5ML PO SOLN: 30.0000 mL | ORAL | Status: DC | PRN

## 2013-04-04 MED ORDER — BENZOCAINE-MENTHOL 20-0.5 % EX AERO
1.0000 "application " | INHALATION_SPRAY | CUTANEOUS | Status: DC | PRN
  Administered 2013-04-04: 1 via TOPICAL
  Filled 2013-04-04: qty 56

## 2013-04-04 MED ORDER — TETANUS-DIPHTH-ACELL PERTUSSIS 5-2.5-18.5 LF-MCG/0.5 IM SUSP: 0.5000 mL | Freq: Once | INTRAMUSCULAR | Status: DC

## 2013-04-04 MED ORDER — WITCH HAZEL-GLYCERIN EX PADS
1.0000 "application " | MEDICATED_PAD | CUTANEOUS | Status: DC | PRN
  Administered 2013-04-04: 1 via TOPICAL

## 2013-04-04 MED ORDER — LACTATED RINGERS IV SOLN
INTRAVENOUS | Status: DC
  Administered 2013-04-05: 10:00:00 via INTRAVENOUS

## 2013-04-04 MED ORDER — LACTATED RINGERS IV SOLN: INTRAVENOUS | Status: DC

## 2013-04-04 MED ORDER — ONDANSETRON HCL 4 MG PO TABS: 4.0000 mg | ORAL_TABLET | ORAL | Status: DC | PRN

## 2013-04-04 MED ORDER — LACTATED RINGERS IV SOLN
INTRAVENOUS | Status: DC
  Administered 2013-04-04: 14:00:00 via INTRAVENOUS

## 2013-04-04 MED ORDER — ACETAMINOPHEN 325 MG PO TABS: 650.0000 mg | ORAL_TABLET | ORAL | Status: DC | PRN

## 2013-04-04 MED ORDER — OXYCODONE-ACETAMINOPHEN 5-325 MG PO TABS
1.0000 | ORAL_TABLET | ORAL | Status: DC | PRN
  Administered 2013-04-05: 1 via ORAL
  Filled 2013-04-04: qty 1

## 2013-04-04 MED ORDER — SIMETHICONE 80 MG PO CHEW: 80.0000 mg | CHEWABLE_TABLET | ORAL | Status: DC | PRN

## 2013-04-04 MED ORDER — PRENATAL MULTIVITAMIN CH
1.0000 | ORAL_TABLET | Freq: Every day | ORAL | Status: DC
  Administered 2013-04-06: 1 via ORAL
  Filled 2013-04-04: qty 1

## 2013-04-04 MED ORDER — METOCLOPRAMIDE HCL 10 MG PO TABS
10.0000 mg | ORAL_TABLET | Freq: Once | ORAL | Status: AC
  Administered 2013-04-05: 10 mg via ORAL
  Filled 2013-04-04: qty 1

## 2013-04-04 NOTE — H&P (Signed)
Rachael Gardner is a 42 y.o. female presenting for admission to L&D for SROM at 9:00 a.  Fluid described as clear with a pink tint.  Patient also reports irregular contractions since yesterday..  Maternal Medical History:  Reason for admission: Rupture of membranes and contractions.   Contractions: Onset was yesterday.   Frequency: irregular.   Perceived severity is mild.    Fetal activity: Perceived fetal activity is normal.   Last perceived fetal movement was within the past hour.    Prenatal complications: no prenatal complications Prenatal Complications - Diabetes: none.    OB History   Grav Para Term Preterm Abortions TAB SAB Ect Mult Living   5 4 4  0 0 0 0 0 0 4     Past Medical History  Diagnosis Date  . Medical history non-contributory    Past Surgical History  Procedure Laterality Date  . Vaginal delivery      X 4, no complications  . No past surgeries     Family History: family history is not on file. Social History:  reports that she has never smoked. She has never used smokeless tobacco. She reports that she does not drink alcohol or use illicit drugs.   Prenatal Transfer Tool  Maternal Diabetes: No Genetic Screening: Declined Maternal Ultrasounds/Referrals: Normal Fetal Ultrasounds or other Referrals:  None Maternal Substance Abuse:  No Significant Maternal Medications:  None Significant Maternal Lab Results:  Lab values include: Group B Strep negative Other Comments:  None  Review of Systems  Constitutional: Negative.   HENT: Negative.   Eyes: Negative.   Respiratory: Negative.   Cardiovascular: Negative.   Gastrointestinal: Positive for heartburn and abdominal pain.  Genitourinary: Negative.        Leaking amniotic fluid  Musculoskeletal: Negative.   Skin: Negative.   Neurological: Negative.   Endo/Heme/Allergies: Negative.   Psychiatric/Behavioral: Negative.     Dilation: 4 Effacement (%): 70 Station: -2 Exam by:: S. Carrera, RNC Blood  pressure 122/80, pulse 81, temperature 98.3 F (36.8 C), temperature source Oral, resp. rate 20, height 5\' 3"  (1.6 m), weight 71.668 kg (158 lb), last menstrual period 06/17/2012.  Maternal Exam:  Uterine Assessment: Contraction strength is mild.  Contraction duration is 30 seconds. Contraction frequency is irregular.   Abdomen: Patient reports no abdominal tenderness. Fetal presentation: vertex  Introitus: Vagina is positive for vaginal discharge.  Ferning test: positive.  Nitrazine test: not done. Amniotic fluid character: clear.  Pelvis: adequate for delivery.   Cervix: Cervix evaluated by digital exam.     Fetal Exam Fetal Monitor Review: Mode: ultrasound.   Baseline rate: 145.  Variability: moderate (6-25 bpm).   Pattern: accelerations present.    Fetal State Assessment: Category I - tracings are normal.     Physical Exam  Constitutional: She is oriented to person, place, and time. She appears well-developed and well-nourished.  HENT:  Head: Normocephalic and atraumatic.  Eyes: Pupils are equal, round, and reactive to light.  Neck: Normal range of motion. Neck supple.  Cardiovascular: Regular rhythm and normal heart sounds.   Respiratory: Effort normal and breath sounds normal.  GI: Soft.  Genitourinary: Uterus normal. Vaginal discharge found.  Clear amniotic fluid.  Musculoskeletal: Normal range of motion.  Neurological: She is alert and oriented to person, place, and time.  Skin: Skin is warm and dry.  Psychiatric: She has a normal mood and affect. Her speech is normal and behavior is normal. Thought content normal.     Prenatal labs: ABO, Rh:  O/POS/-- (07/16 1005) Antibody: NEG (07/16 1005) Rubella: 2.95 (07/16 1005) RPR: NON REAC (07/16 1005)  HBsAg: NEGATIVE (07/16 1005)  HIV: NON REACTIVE (07/16 1005)  GBS: Negative (10/08 0000)   Assessment/Plan: 42 yo G5P4004 at 39.[redacted] weeks gestation IUP SROM Negative GBS  Admit to L&D  Continue to monitor labor  progress. Anticipate NSVD  Selena Lesser 04/04/2013, 2:54 PM  I examined pt and agree with documentation above and nurse midwife student plan of care. [facility]

## 2013-04-04 NOTE — MAU Note (Signed)
Noted some leaking of fluid around 0900, states was clear with a little pink. States it soaked a pad and she had to change pad. States she had some contractions since yesterday.

## 2013-04-05 ENCOUNTER — Encounter (HOSPITAL_COMMUNITY)
Admission: AD | Disposition: A | Discharge status: Home / Self Care | Payer: Self-pay | Source: Ambulatory Visit | Attending: Obstetrics & Gynecology

## 2013-04-05 ENCOUNTER — Encounter: Payer: Self-pay | Admitting: Obstetrics & Gynecology

## 2013-04-05 ENCOUNTER — Encounter: Payer: Self-pay | Admitting: Family

## 2013-04-05 ENCOUNTER — Inpatient Hospital Stay (HOSPITAL_COMMUNITY): Payer: No Typology Code available for payment source | Admitting: Anesthesiology

## 2013-04-05 ENCOUNTER — Encounter (HOSPITAL_COMMUNITY): Payer: No Typology Code available for payment source | Admitting: Anesthesiology

## 2013-04-05 ENCOUNTER — Encounter (HOSPITAL_COMMUNITY): Payer: Self-pay | Admitting: Anesthesiology

## 2013-04-05 DIAGNOSIS — Z302 Encounter for sterilization: Secondary | ICD-10-CM

## 2013-04-05 HISTORY — PX: TUBAL LIGATION: SHX77

## 2013-04-05 LAB — SURGICAL PCR SCREEN
MRSA, PCR: NEGATIVE
Staphylococcus aureus: NEGATIVE

## 2013-04-05 LAB — CBC
HCT: 31.8 % — ABNORMAL LOW (ref 36.0–46.0)
MCHC: 34.6 g/dL (ref 30.0–36.0)
Platelets: 140 10*3/uL — ABNORMAL LOW (ref 150–400)
RBC: 3.63 MIL/uL — ABNORMAL LOW (ref 3.87–5.11)
RDW: 13.5 % (ref 11.5–15.5)
WBC: 9.9 10*3/uL (ref 4.0–10.5)

## 2013-04-05 SURGERY — LIGATION, FALLOPIAN TUBE, POSTPARTUM
Anesthesia: Spinal | Laterality: Bilateral

## 2013-04-05 MED ORDER — BUPIVACAINE IN DEXTROSE 0.75-8.25 % IT SOLN
INTRATHECAL | Status: DC | PRN
  Administered 2013-04-05: 12 mg via INTRATHECAL

## 2013-04-05 MED ORDER — MIDAZOLAM HCL 2 MG/2ML IJ SOLN
INTRAMUSCULAR | Status: AC
  Filled 2013-04-05: qty 2

## 2013-04-05 MED ORDER — BUPIVACAINE HCL (PF) 0.25 % IJ SOLN
INTRAMUSCULAR | Status: AC
  Filled 2013-04-05: qty 30

## 2013-04-05 MED ORDER — KETOROLAC TROMETHAMINE 30 MG/ML IJ SOLN: INTRAMUSCULAR | Status: DC | PRN

## 2013-04-05 MED ORDER — MIDAZOLAM HCL 5 MG/5ML IJ SOLN
INTRAMUSCULAR | Status: DC | PRN
  Administered 2013-04-05 (×2): 1 mg via INTRAVENOUS

## 2013-04-05 MED ORDER — BUPIVACAINE HCL (PF) 0.25 % IJ SOLN
INTRAMUSCULAR | Status: DC | PRN
  Administered 2013-04-05: 10 mL

## 2013-04-05 SURGICAL SUPPLY — 23 items
CHLORAPREP W/TINT 26ML (MISCELLANEOUS) ×2 IMPLANT
CONTAINER PREFILL 10% NBF 15ML (MISCELLANEOUS) ×4 IMPLANT
DERMABOND ADVANCED (GAUZE/BANDAGES/DRESSINGS) ×1
DERMABOND ADVANCED .7 DNX12 (GAUZE/BANDAGES/DRESSINGS) ×1 IMPLANT
GLOVE BIOGEL PI IND STRL 6.5 (GLOVE) ×1 IMPLANT
GLOVE BIOGEL PI IND STRL 7.0 (GLOVE) ×1 IMPLANT
GLOVE BIOGEL PI INDICATOR 6.5 (GLOVE) ×1
GLOVE BIOGEL PI INDICATOR 7.0 (GLOVE) ×1
GLOVE ECLIPSE 7.0 STRL STRAW (GLOVE) ×2 IMPLANT
GLOVE SURG SS PI 6.0 STRL IVOR (GLOVE) ×2 IMPLANT
GOWN PREVENTION PLUS LG XLONG (DISPOSABLE) ×4 IMPLANT
NEEDLE HYPO 25X1 1.5 SAFETY (NEEDLE) IMPLANT
NS IRRIG 1000ML POUR BTL (IV SOLUTION) ×2 IMPLANT
PACK ABDOMINAL MINOR (CUSTOM PROCEDURE TRAY) ×2 IMPLANT
SPONGE LAP 4X18 X RAY DECT (DISPOSABLE) IMPLANT
SUT PLAIN 0 NONE (SUTURE) ×2 IMPLANT
SUT VIC AB 0 CT1 27 (SUTURE) ×1
SUT VIC AB 0 CT1 27XBRD ANBCTR (SUTURE) ×1 IMPLANT
SUT VIC AB 3-0 PS2 18 (SUTURE) ×2 IMPLANT
SYR CONTROL 10ML LL (SYRINGE) IMPLANT
TOWEL OR 17X24 6PK STRL BLUE (TOWEL DISPOSABLE) ×4 IMPLANT
TRAY FOLEY CATH 14FR (SET/KITS/TRAYS/PACK) ×2 IMPLANT
WATER STERILE IRR 1000ML POUR (IV SOLUTION) IMPLANT

## 2013-04-05 NOTE — Progress Notes (Signed)
42 y.o. yo [id number]  with undesired fertility,status post vaginal delivery, desires permanent sterilization. Risks and benefits of procedure discussed with patient including permanence of method, bleeding, infection, injury to surrounding organs and need for additional procedures. Risk failure of 0.5-1% with increased risk of ectopic gestation if pregnancy occurs was also discussed with patient. Patient verbalized understanding and all questions were answered.

## 2013-04-05 NOTE — Lactation Note (Signed)
This note was copied from the chart of Rachael Sheralee Ricketson. Lactation Consultation Note Initial visit with mom at 30 hours.  [facility] resources given and discussed.  Encouraged cue feeding, skin to skin and hand expression. Baby latched to left breast.  Assistance with positioning to maintain depth at breast.  Baby sucking with good rhythm and swallows observed.  Mom has 4 other children that she also breast fed with. Referred to baby and me book regarding breast care.  Mom to call for assist as needed.   Patient Name: Rachael Gardner [id number] Date: 04/05/2013 Reason for consult: Initial assessment   Maternal Data    Feeding Feeding Type: Breast Fed Length of feed: 30 min  LATCH Score/Interventions Latch: Grasps breast easily, tongue down, lips flanged, rhythmical sucking.  Audible Swallowing: A few with stimulation Intervention(s): Skin to skin;Hand expression Intervention(s): Skin to skin;Hand expression;Alternate breast massage  Type of Nipple: Everted at rest and after stimulation  Comfort (Breast/Nipple): Soft / non-tender     Hold (Positioning): Assistance needed to correctly position infant at breast and maintain latch. (positional help for depth)  LATCH Score: 8  Lactation Tools Discussed/Used     Consult Status Consult Status: Follow-up Date: 04/06/13 Follow-up type: In-patient    Jannifer Rodney 04/05/2013, 10:49 PM

## 2013-04-05 NOTE — Progress Notes (Signed)
Post Partum Day 1 Subjective: no complaints, up ad lib, voiding, tolerating PO and + flatus  Rachael Gardner is a 42 y/o [id number] who presented on 10/28 for SROM and SOL.  She delivered a viable baby boy via NSVD. EBL during delivery , and a 1st degree perineal laceration was repaired.  Pre-delivery CBC: RBC 3.98, Hb 12.0, Hct 35.0.  Post-partum CBC: RBC 3.63, Hb 11.0, hct 31.8. Vitals stable.  Rachael Gardner has no complaints at this time other than mild pain that is improving from yesterday, rated at 4-10.  She is taking motrin 600mg  Q6H, which helps.  She reports that her bleeding has improved, but is still like a heavy period.  She describes passing some small clots, less that a quarter-size. She has been ambulating per normal and denies any leg pain or swelling.  She has been urinating and passing gas per usual, but has not had a BM yet.  Her appetite has returned, but she is NPO for her BTL scheduled at 10:15am on 10/29. She is breastfeeding and has seen lactation.   Objective: Blood pressure 109/73, pulse 74, temperature 98.1 F (36.7 C), temperature source Oral, resp. rate 19, height 5\' 3"  (1.6 m), weight 71.668 kg (158 lb), last menstrual period 06/17/2012, SpO2 98.00%  Physical Exam:  General: alert, cooperative, appears stated age and no distress Lochia: appropriate Uterine Fundus: firm Incision: healing well DVT Evaluation: No evidence of DVT seen on physical exam. Negative Homan's sign. No cords or calf tenderness. No significant calf/ankle edema.   Recent Labs  04/04/13 1429 04/05/13 0600  HGB 12.0 11.0*  HCT 35.0* 31.8*    Assessment/Plan: Breastfeeding, No Circumcision planned or desired, and Contraception BTL scheduled for 10:15am   LOS: 1 day   Christiana Fuchs 04/05/2013, 7:43 AM   I have seen and examined this patient and agree with above documentation in the PA student's note.   Patient desires permanent sterilization.  Other reversible forms of contraception  were discussed with patient; she declines all other modalities. Risks of procedure discussed with patient including but not limited to: risk of regret, permanence of method, bleeding, infection, injury to surrounding organs and need for additional procedures.  Failure risk of 1-2 % with increased risk of ectopic gestation if pregnancy occurs was also discussed with patient.  Patient verbalized understanding of these risks and wants to proceed with sterilization.     Rulon Abide, M.D. [facility] Fellow 04/05/2013 8:04 AM

## 2013-04-05 NOTE — Anesthesia Procedure Notes (Signed)
Spinal  Patient location during procedure: OR Start time: 04/05/2013 10:28 AM End time: 04/05/2013 10:30 AM Reason for block: procedure for pain Staffing Anesthesiologist: Leilani Able Performed by: anesthesiologist  Preanesthetic Checklist Completed: patient identified, surgical consent, pre-op evaluation, timeout performed, IV checked, risks and benefits discussed and monitors and equipment checked Spinal Block Patient position: sitting Prep: DuraPrep Patient monitoring: heart rate, cardiac monitor, continuous pulse ox and blood pressure Approach: midline Location: L3-4 Injection technique: single-shot Needle Needle type: Sprotte and Pencan  Needle gauge: 24 G Needle length: 9 cm Needle insertion depth: 4 cm Assessment Sensory level: T4

## 2013-04-05 NOTE — Transfer of Care (Signed)
Immediate Anesthesia Transfer of Care Note  Patient: Rachael Gardner  Procedure(s) Performed: Procedure(s): POST PARTUM TUBAL LIGATION (Bilateral)  Patient Location: PACU  Anesthesia Type:Spinal  Level of Consciousness: awake, alert , oriented and patient cooperative  Airway & Oxygen Therapy: Patient Spontanous Breathing and Patient connected to nasal cannula oxygen  Post-op Assessment: Report given to PACU RN and Post -op Vital signs reviewed and stable  Post vital signs: Reviewed and stable  Complications: No apparent anesthesia complications

## 2013-04-05 NOTE — Anesthesia Postprocedure Evaluation (Signed)
  Anesthesia Post Note  Patient: Rachael Gardner  Procedure(s) Performed: Procedure(s) (LRB): POST PARTUM TUBAL LIGATION (Bilateral)  Anesthesia type: Spinal  Patient location: PACU  Post pain: Pain level controlled  Post assessment: Post-op Vital signs reviewed  Last Vitals:  Filed Vitals:   04/05/13 0702  BP: 109/73  Pulse: 74  Temp: 36.7 C  Resp: 19    Post vital signs: Reviewed  Level of consciousness: awake  Complications: No apparent anesthesia complications

## 2013-04-05 NOTE — Op Note (Signed)
04/05/2013  PREOPERATIVE DIAGNOSIS:  Undesired fertility  POSTOPERATIVE DIAGNOSIS:  Undesired fertility  PROCEDURE:  Postpartum Bilateral Tubal Sterilization using Pomeroy method   ANESTHESIA:  Epidural  COMPLICATIONS:  None immediate.  ESTIMATED BLOOD LOSS:  Less than 20cc.  FLUIDS: 800 cc LR.  URINE OUTPUT:  150 cc of clear urine.  INDICATIONS: 42 y.o. yo [id number]  with undesired fertility,status post vaginal delivery, desires permanent sterilization. Risks and benefits of procedure discussed with patient including permanence of method, bleeding, infection, injury to surrounding organs and need for additional procedures. Risk failure of 0.5-1% with increased risk of ectopic gestation if pregnancy occurs was also discussed with patient.   FINDINGS:  Normal uterus, tubes, and ovaries.  TECHNIQUE: After informed consent was obtained, the patient was taken to the operating room where anesthesia was induced and found to be adequate. A small transverse, infraumbilical skin incision was made with the scalpel. This incision was carried down to the underlying layer of fascia. The fascia was grasped with Kocher clamps tented up and entered sharply with Mayo scissors. Underlying peritoneum was then identified tented up and entered sharply with Metzenbaum scissors. The fascia was tagged with 0 Vicryl. The patient's left fallopian tube was then identified, brought to the incision, and grasped with a Babcock clamp. The tube was then followed out to the fimbria. The Babcock clamp was then used to grasp the tube approximately 4 cm from the cornual region. A 3 cm segment of the tube was then ligated with free tie of plain gut suture, transected and excised. Good hemostasis was noted and the tube was returned to the abdomen. The right fallopian tube was then identified to its fimbriated end, ligated, and a 3 cm segment excised in a similar fashion. Excellent hemostasis was noted, and the tube returned to the  abdomen. The fascia was re-approximated with 0 Vicryl. The skin was closed in a subcuticular fashion with 3-0 Vicryl. Quarter percent Marcaine solution was then injected at the incision site. The patient tolerated the procedure well. Sponge, lap, and needle count were correct x2. The patient was taken to recovery room in stable condition.

## 2013-04-05 NOTE — Anesthesia Preprocedure Evaluation (Addendum)
Anesthesia Evaluation  Patient identified by MRN, date of birth, ID band Patient awake    Reviewed: Allergy & Precautions, H&P , NPO status , Patient's Chart, lab work & pertinent test results  Airway Mallampati: I TM Distance: >3 FB Neck ROM: full    Dental no notable dental hx.    Pulmonary neg pulmonary ROS,    Pulmonary exam normal       Cardiovascular negative cardio ROS      Neuro/Psych negative neurological ROS  negative psych ROS   GI/Hepatic negative GI ROS, Neg liver ROS,   Endo/Other  negative endocrine ROS  Renal/GU negative Renal ROS  negative genitourinary   Musculoskeletal negative musculoskeletal ROS (+)   Abdominal Normal abdominal exam  (+)   Peds  Hematology negative hematology ROS (+)   Anesthesia Other Findings   Reproductive/Obstetrics                           Anesthesia Physical Anesthesia Plan  ASA: II  Anesthesia Plan: Spinal   Post-op Pain Management:    Induction:   Airway Management Planned:   Additional Equipment:   Intra-op Plan:   Post-operative Plan:   Informed Consent: I have reviewed the patients History and Physical, chart, labs and discussed the procedure including the risks, benefits and alternatives for the proposed anesthesia with the patient or authorized representative who has indicated his/her understanding and acceptance.     Plan Discussed with: CRNA and Surgeon  Anesthesia Plan Comments:         Anesthesia Quick Evaluation

## 2013-04-05 NOTE — Anesthesia Postprocedure Evaluation (Signed)
  Anesthesia Post-op Note  Patient: Rachael Gardner  Procedure(s) Performed: Procedure(s): POST PARTUM TUBAL LIGATION (Bilateral)  Patient Location: Mother/Baby  Anesthesia Type:Spinal  Level of Consciousness: awake, alert , oriented and patient cooperative  Airway and Oxygen Therapy: Patient Spontanous Breathing  Post-op Pain: mild  Post-op Assessment: Post-op Vital signs reviewed, Patient's Cardiovascular Status Stable, Respiratory Function Stable, Patent Airway, No signs of Nausea or vomiting, Adequate PO intake and Pain level controlled  Post-op Vital Signs: Reviewed and stable  Complications: No apparent anesthesia complications

## 2013-04-06 ENCOUNTER — Encounter (HOSPITAL_COMMUNITY): Payer: Self-pay | Admitting: Obstetrics and Gynecology

## 2013-04-06 MED ORDER — OXYCODONE-ACETAMINOPHEN 5-325 MG PO TABS: 1.0000 | ORAL_TABLET | ORAL | Status: DC | PRN

## 2013-04-06 MED ORDER — IBUPROFEN 600 MG PO TABS: 600.0000 mg | ORAL_TABLET | Freq: Four times a day (QID) | ORAL | Status: DC

## 2013-04-06 NOTE — Discharge Summary (Signed)
Obstetric Discharge Summary Reason for Admission: onset of labor Prenatal Procedures: none Intrapartum Procedures: spontaneous vaginal delivery Postpartum Procedures: P.P. tubal ligation Complications-Operative and Postpartum: 1st degree perineal laceration, appropriately repaired and mild post-partum hemorrhage w/ EBL ; bleeding stopped with uterine massage.  Hemoglobin  Date Value Range Status  04/05/2013 11.0* 12.0 - 15.0 g/dL Final     HCT  Date Value Range Status  04/05/2013 31.8* 36.0 - 46.0 % Final    Physical Exam:  General: alert, cooperative and no distress Lochia: appropriate Uterine Fundus: firm Incision: healing well, no significant drainage DVT Evaluation: No evidence of DVT seen on physical exam. Negative Homan's sign. No cords or calf tenderness. No significant calf/ankle edema.  Discharge Diagnoses: Term Pregnancy-delivered  Discharge Information: Date: 04/06/2013 Activity: unrestricted Diet: routine Medications: Ibuprofen and Percocet Condition: stable Instructions: f/u for post-partum check in 4-6 weeks Discharge to: home  Newborn Data: Live born female  Birth Weight: 6 lb 14.8 oz (3141 g) APGAR: 9, 9  Home with mother.  Discharge Summary:  Rachael Gardner is a 42 y/o G5P5005 who presented on 10/28 for SROM and SOL. She delivered a viable baby boy via NSVD. EBL during delivery , and a 1st degree perineal laceration was repaired. Pre-delivery CBC: RBC 3.98, Hb 12.0, Hct 35.0. Post-partum CBC: RBC 3.63, Hb 11.0, hct 31.8. Vitals stable. Rachael Gardner desired permanent sterilization and was educated on all the risks and outcomes of the procedure.  She was scheduled for and had a BTL on 10/29.  Today, she is feeling well and c/o mild pain and soreness of the incision site and her lower abdomen.  She was given percocet for pain last night, but has only had ibuprofen today.  She will be d/c with pain medications prn.  The incision is covered, and the bandage  is unsaturated.  No drainage or bleeding noted.  Her vaginal bleeding has improved and is not like a normal period.  She is ambulatory and has been eating and urinating normally.  She had passed flatus, but has not had a BM yet. She continues to breast feed, and is not planning circumcision for baby.     Rachael Gardner 04/06/2013, 7:32 AM  I have seen and examined this patient and I agree with the above. Rachael Gardner 7:52 AM 04/06/2013

## 2013-04-06 NOTE — Lactation Note (Signed)
This note was copied from the chart of Rachael Derriona Appleby. Lactation Consultation Note: Followup visit with mom before DC. Mom latched baby on easily by herself. Requesting manual pump- given with instructions for use and cleaning. No questions at present. To call prn  Patient Name: Rachael Gardner [id number] Date: 04/06/2013 Reason for consult: Follow-up assessment   Maternal Data    Feeding Feeding Type: Breast Fed  LATCH Score/Interventions Latch: Grasps breast easily, tongue down, lips flanged, rhythmical sucking.  Audible Swallowing: A few with stimulation  Type of Nipple: Everted at rest and after stimulation  Comfort (Breast/Nipple): Soft / non-tender     Hold (Positioning): No assistance needed to correctly position infant at breast.  LATCH Score: 9  Lactation Tools Discussed/Used Pump Review: Setup, frequency, and cleaning   Consult Status Consult Status: Complete    Pamelia Hoit 04/06/2013, 9:23 AM

## 2013-04-07 NOTE — Progress Notes (Signed)
Post discharge chart review completed.  

## 2013-04-09 NOTE — Discharge Summary (Signed)
Attestation of Attending Supervision of Advanced Practitioner: Evaluation and management procedures were performed by the PA/NP/CNM/OB Fellow under my supervision/collaboration. Chart reviewed and agree with management and plan.  Maikol Grassia V 04/09/2013 9:26 PM   

## 2013-05-11 ENCOUNTER — Ambulatory Visit (INDEPENDENT_AMBULATORY_CARE_PROVIDER_SITE_OTHER): Payer: No Typology Code available for payment source | Admitting: Advanced Practice Midwife

## 2013-05-11 ENCOUNTER — Encounter: Payer: Self-pay | Admitting: Obstetrics & Gynecology

## 2013-05-11 VITALS — BP 119/83 | HR 90 | Ht 63.0 in | Wt 141.8 lb

## 2013-05-11 DIAGNOSIS — IMO0001 Reserved for inherently not codable concepts without codable children: Secondary | ICD-10-CM

## 2013-05-11 DIAGNOSIS — Z8742 Personal history of other diseases of the female genital tract: Secondary | ICD-10-CM

## 2013-05-11 MED ORDER — POLYETHYLENE GLYCOL 3350 17 G PO PACK: 17.0000 g | PACK | Freq: Every day | ORAL | Status: DC

## 2013-05-11 NOTE — Progress Notes (Signed)
  Subjective:     Rachael Gardner is a 42 y.o. female who presents for a postpartum visit. She is 5 weeks postpartum following a spontaneous vaginal delivery. I have fully reviewed the prenatal and intrapartum course. The delivery was at term. Outcome: spontaneous vaginal delivery. Anesthesia: epidural. Postpartum course has been Uneventful, except for postpartum BTL. Baby's course has been uneventful. Baby is feeding by breast. Bleeding no bleeding. Bowel function is remarkable for constipation. Bladder function is normal. Patient is not sexually active. Contraception method is tubal ligation. Postpartum depression screening: negative. Score = 0  The following portions of the patient's history were reviewed and updated as appropriate: allergies, current medications, past family history, past medical history, past social history, past surgical history and problem list.  Review of Systems Pertinent items are noted in HPI.   Objective:    BP 119/83  Pulse 90  Ht 5\' 3"  (1.6 m)  Wt 141 lb 12.8 oz (64.32 kg)  BMI 25.13 kg/m2  Breastfeeding? Yes  General:  alert, cooperative and no distress   Breasts:  inspection negative, no nipple discharge or bleeding, no masses or nodularity palpable  Lungs: clear to auscultation bilaterally  Heart:  regular rate and rhythm, S1, S2 normal, no murmur, click, rub or gallop  Abdomen: soft, non-tender; bowel sounds normal; no masses,  no organomegaly   Vulva:  normal and well healed laceration  Vagina: normal vagina  Cervix:  no cervical motion tenderness  Corpus: normal size, contour, position, consistency, mobility, non-tender  Adnexa:  no mass, fullness, tenderness  Rectal Exam: Not performed.        Assessment:     Normal postpartum exam. Pap smear not done at today's visit.   Plan:    1. Contraception: tubal ligation 2. Rx Miralax sent to pharmacy 3. Follow up in: 1 year or as needed.

## 2013-05-11 NOTE — Patient Instructions (Signed)
Sterilization Information, Female Female sterilization is a procedure to permanently prevent pregnancy. There are different ways to perform sterilization, but all either block or close the fallopian tubes so that your eggs cannot reach your uterus. If your egg cannot reach your uterus, sperm cannot fertilize the egg, and you cannot get pregnant.  Sterilization is performed by a surgical procedure. Sometimes these procedures are performed in a hospital while a patient is asleep. Sometimes they can be done in a clinic setting with the patient awake. The fallopian tubes can be surgically cut, tied, or sealed through a procedure called tubal ligation. The fallopian tubes can also be closed with clips or rings. Sterilization can also be done by placing a tiny coil into each fallopian tube, which causes scar tissue to grow inside the tube. The scar tissue then blocks the tubes.  Discuss sterilization with your caregiver to answer any concerns you or your partner may have. You may want to ask what type of sterilization your caregiver performs. Some caregivers may not perform all the various options. Sterilization is permanent and should only be done if you are sure you do not want children or do not want any more children. Having a sterilization reversed may not be successful.  STERILIZATION PROCEDURES  Laparoscopic sterilization. This is a surgical method performed at a time other than right after childbirth. Two incisions are made in the lower abdomen. A thin, lighted tube (laparoscope) is inserted into one of the incisions and is used to perform the procedure. The fallopian tubes are closed with a ring or a clip. An instrument that uses heat could be used to seal the tubes closed (electrocautery).   Mini-laparotomy. This is a surgical method done 1 or 2 days after giving birth. Typically, a small incision is made just below the belly button (umbilicus) and the fallopian tubes are exposed. The tubes can then be  sealed, tied, or cut.   Hysteroscopic sterilization. This is performed at a time other than right after childbirth. A tiny, spring-like coil is inserted through the cervix and uterus and placed into the fallopian tubes. The coil causes scaring and blocks the tubes. Other forms of contraception should be used for 3 months after the procedure to allow the scar tissue to form completely. Additionally, it is required hysterosalpingography be done 3 months later to ensure that the procedure was successful. Hysterosalpingography is a procedure that uses X-rays to look at your uterus and fallopian tubes after a material to make them show up better has been inserted. IS STERILIZATION SAFE? Sterilization is considered safe with very rare complications. Risks depend on the type of procedure you have. As with any surgical procedure, there are risks. Some risks of sterilization by any means include:   Bleeding.  Infection.  Reaction to anesthesia medicine.  Injury to surrounding organs. Risks specific to having hysteroscopic coils placed include:  The coils may not be placed correctly the first time.   The coils may move out of place.   The tubes may not get completely blocked after 3 months.   Injury to surrounding organs when placing the coil.  HOW EFFECTIVE IS FEMALE STERILIZATION? Sterilization is nearly 100% effective, but it can fail. Depending on the type of sterilization, the rate of failure can be as high as 3%. After hysteroscopic sterilization with placement of fallopian tube coils, you will need back-up birth control for 3 months after the procedure. Sterilization is effective for a lifetime.  BENEFITS OF STERILIZATION  It does   not affect your hormones, and therefore will not affect your menstrual periods, sexual desire, or performance.   It is effective for a lifetime.   It is safe.   You do not need to worry about getting pregnant. Keep in mind that if you had the  hysteroscopic placement procedure, you must wait 3 months after the procedure (or until your caregiver confirms) before pregnancy is not considered possible.   There are no side effects unlike other types of birth control (contraception).  DRAWBACKS OF STERILIZATION  You must be sure you do not want children or any more children. The procedure is permanent.   It does not provide protection against sexually transmitted infections (STIs).   The tubes can grow back together. If this happens, there is a risk of pregnancy. There is also an increased risk (50%) of pregnancy being an ectopic pregnancy. This is a pregnancy that happens outside of the uterus. Document Released: 11/11/2007 Document Revised: 11/24/2011 Document Reviewed: 09/10/2011 [employer] Patient Information 2014 [employer].  

## 2014-04-09 ENCOUNTER — Encounter: Payer: Self-pay | Admitting: Obstetrics & Gynecology

## 2016-01-24 DIAGNOSIS — M71572 Other bursitis, not elsewhere classified, left ankle and foot: Secondary | ICD-10-CM | POA: Diagnosis not present

## 2016-01-24 DIAGNOSIS — M2042 Other hammer toe(s) (acquired), left foot: Secondary | ICD-10-CM | POA: Diagnosis not present

## 2016-01-26 ENCOUNTER — Emergency Department (HOSPITAL_COMMUNITY): Payer: BLUE CROSS/BLUE SHIELD

## 2016-01-26 ENCOUNTER — Emergency Department (HOSPITAL_COMMUNITY)
Admission: EM | Admit: 2016-01-26 | Discharge: 2016-01-26 | Disposition: A | Discharge status: Home / Self Care | Payer: BLUE CROSS/BLUE SHIELD | Attending: Emergency Medicine | Admitting: Emergency Medicine

## 2016-01-26 ENCOUNTER — Encounter (HOSPITAL_COMMUNITY): Payer: Self-pay | Admitting: Emergency Medicine

## 2016-01-26 ENCOUNTER — Encounter (HOSPITAL_BASED_OUTPATIENT_CLINIC_OR_DEPARTMENT_OTHER): Payer: Self-pay

## 2016-01-26 DIAGNOSIS — R102 Pelvic and perineal pain: Secondary | ICD-10-CM | POA: Insufficient documentation

## 2016-01-26 DIAGNOSIS — Z79899 Other long term (current) drug therapy: Secondary | ICD-10-CM | POA: Insufficient documentation

## 2016-01-26 DIAGNOSIS — R1032 Left lower quadrant pain: Secondary | ICD-10-CM

## 2016-01-26 LAB — BASIC METABOLIC PANEL
ANION GAP: 9 (ref 5–15)
BUN: 8 mg/dL (ref 6–20)
CO2: 25 mmol/L (ref 22–32)
CREATININE: 0.87 mg/dL (ref 0.44–1.00)
Calcium: 9 mg/dL (ref 8.9–10.3)
Chloride: 105 mmol/L (ref 101–111)
GFR calc Af Amer: >60 mL/min (ref >60)
Glucose, Bld: 85 mg/dL (ref 65–99)
Potassium: 4.3 mmol/L (ref 3.5–5.1)
Sodium: 139 mmol/L (ref 135–145)

## 2016-01-26 LAB — WET PREP, GENITAL
Sperm: NONE SEEN
Trich, Wet Prep: NONE SEEN
Yeast Wet Prep HPF POC: NONE SEEN

## 2016-01-26 LAB — URINALYSIS, ROUTINE W REFLEX MICROSCOPIC
BILIRUBIN URINE: NEGATIVE
Glucose, UA: NEGATIVE mg/dL
Hgb urine dipstick: NEGATIVE
Ketones, ur: NEGATIVE mg/dL
Leukocytes, UA: NEGATIVE
NITRITE: NEGATIVE
Protein, ur: NEGATIVE mg/dL
SPECIFIC GRAVITY, URINE: 1.021 (ref 1.005–1.030)
pH: 6.5 (ref 5.0–8.0)

## 2016-01-26 LAB — CBC
HCT: 37.8 % (ref 36.0–46.0)
Hemoglobin: 12.5 g/dL (ref 12.0–15.0)
MCH: 30.3 pg (ref 26.0–34.0)
MCHC: 33.1 g/dL (ref 30.0–36.0)
MCV: 91.5 fL (ref 78.0–100.0)
Platelets: 200 10*3/uL (ref 150–400)
RBC: 4.13 MIL/uL (ref 3.87–5.11)
RDW: 13.1 % (ref 11.5–15.5)
WBC: 5.5 10*3/uL (ref 4.0–10.5)

## 2016-01-26 LAB — I-STAT BETA HCG BLOOD, ED (MC, WL, AP ONLY)

## 2016-01-26 MED ORDER — IBUPROFEN 600 MG PO TABS
600.0000 mg | ORAL_TABLET | Freq: Four times a day (QID) | ORAL | 0 refills | Status: DC | PRN
Start: 2016-01-26 — End: 2016-03-25

## 2016-01-26 NOTE — Discharge Instructions (Signed)
Take ibuprofen for pain. Avoid strenuous activity today. If pain returns, make sure to follow up with your doctor or return to emergency department. Return if any fever, vomiting, blood in stool.

## 2016-01-26 NOTE — ED Triage Notes (Addendum)
Pt reports LLQ abdominal pain that woke her from her sleep. 10/10 pain. Pt describes as cramping. Pt states stomach appears more bloated than normal. LBM yesterday. Pt has been passing gas. No associated urinary symptoms. Pt does admit to thin white vaginal discharge that has no odor to it. LMP 01/07/16.

## 2016-01-26 NOTE — ED Provider Notes (Signed)
[facility] DEPT Provider Note   CSN: [id number] Arrival date & time: 01/26/16  0610     History   Chief Complaint Chief Complaint  Patient presents with  . Abdominal Pain    HPI Rachael Gardner is a 45 y.o. female.  HPI Rachael NorthLoraine Rachael Gardner is a 45 y.o. female with no medical problems, presents to ED with complaint of sudden onset of left adnexal pain starting this morning. Pt states pain was sharp, now easing off. Reports associated nausea, dizziness, diaphoresis. Denies vomiting. No diarrhea. No blood in stool. No urinary symptoms. Did not take anything for her symptoms prior to coming in. Denies any fever or chills. No history of similar pain in the past. History of tubal ligation, no other abdominal surgeries. Does not think her abdomen is swollen or distended.  Past Medical History:  Diagnosis Date  . Medical history non-contributory     Patient Active Problem List   Diagnosis Date Noted  . Sterilization 04/05/2013  . AMA (advanced maternal age) multigravida 35+ 03/29/2013    Past Surgical History:  Procedure Laterality Date  . NO PAST SURGERIES    . TUBAL LIGATION Bilateral 04/05/2013   Procedure: POST PARTUM TUBAL LIGATION;  Surgeon: Catalina AntiguaPeggy Constant, MD;  Location: [facility];  Service: Gynecology;  Laterality: Bilateral;  . VAGINAL DELIVERY     X 4, no complications    OB History    Gravida Para Term Preterm AB Living   5 5 5  0 0 5   SAB TAB Ectopic Multiple Live Births   0 0 0 0 5       Home Medications    Prior to Admission medications   Medication Sig Start Date End Date Taking? Authorizing Provider  ibuprofen (ADVIL,MOTRIN) 600 MG tablet Take 1 tablet (600 mg total) by mouth every 6 (six) hours. 04/06/13   Arabella MerlesKimberly D Shaw, CNM  oxyCODONE-acetaminophen (PERCOCET/ROXICET) 5-325 MG per tablet Take 1-2 tablets by mouth every 4 (four) hours as needed. 04/06/13   Arabella MerlesKimberly D Shaw, CNM  polyethylene glycol [facility](MIRALAX) packet Take 17 g by mouth daily. 05/11/13   Aviva SignsMarie  L Williams, CNM  Prenatal Vit-Fe Fumarate-FA (PRENATAL MULTIVITAMIN) TABS Take 1 tablet by mouth daily at 12 noon.    Historical Provider, MD    Family History No family history on file.  Social History Social History  Substance Use Topics  . Smoking status: Never Smoker  . Smokeless tobacco: Never Used  . Alcohol use No     Allergies   Review of patient's allergies indicates no known allergies.   Review of Systems Review of Systems  Constitutional: Positive for diaphoresis. Negative for chills and fever.  Respiratory: Negative for cough, chest tightness and shortness of breath.   Cardiovascular: Negative for chest pain, palpitations and leg swelling.  Gastrointestinal: Positive for abdominal pain and nausea. Negative for diarrhea and vomiting.  Genitourinary: Positive for pelvic pain and vaginal discharge. Negative for dysuria, flank pain, vaginal bleeding and vaginal pain.  Musculoskeletal: Negative for arthralgias, myalgias, neck pain and neck stiffness.  Skin: Negative for rash.  Neurological: Positive for dizziness. Negative for weakness and headaches.  All other systems reviewed and are negative.    Physical Exam Updated Vital Signs Ht 5\' 5"  (1.651 m)   Wt 59 kg   LMP 01/07/2016 Comment: Tubal ligation  SpO2 99%   BMI 21.63 kg/m   Physical Exam  Constitutional: She appears well-developed and well-nourished. No distress.  HENT:  Head: Normocephalic.  Eyes: Conjunctivae are normal.  Neck: Neck supple.  Cardiovascular: Normal rate, regular rhythm and normal heart sounds.   Pulmonary/Chest: Effort normal and breath sounds normal. No respiratory distress. She has no wheezes. She has no rales.  Abdominal: Soft. Bowel sounds are normal. She exhibits no distension. There is tenderness. There is no rebound.  Left lower abdomen tenderness  Genitourinary:  Genitourinary Comments: Normal external genitalia. Normal vaginal canal. Small thin white discharge. Cervix is  normal, closed. No CMT. No uterine  Tenderness. Left adnexal tenderness present. No tenderness on the right.  No masses palpated.    Musculoskeletal: She exhibits no edema.  Neurological: She is alert.  Skin: Skin is warm and dry.  Psychiatric: She has a normal mood and affect. Her behavior is normal.  Nursing note and vitals reviewed.    ED Treatments / Results  Labs (all labs ordered are listed, but only abnormal results are displayed) Labs Reviewed  WET PREP, GENITAL  CBC  URINALYSIS, ROUTINE W REFLEX MICROSCOPIC (NOT AT [facility])  BASIC METABOLIC PANEL  I-STAT BETA HCG BLOOD, ED ([facility], [facility], [facility] ONLY)  GC/CHLAMYDIA PROBE AMP ([place]) NOT AT [facility]    EKG  EKG Interpretation None       Radiology No results found.  Procedures Procedures (including critical care time)  Medications Ordered in ED Medications - No data to display   Initial Impression / Assessment and Plan / ED Course  I have reviewed the triage vital signs and the nursing notes.  Pertinent labs & imaging results that were available during my care of the patient were reviewed by me and considered in my medical decision making (see chart for details).  Clinical Course  Comment By Time  Patient seen and examined. Patient with left lower quadrant abdominal pain, sudden onset this morning, associated nausea, diaphoresis, dizziness. Pain is now resolving. Patient continues to have some tenderness in the left lower quadrant. She has no changes in her bowels, pain is more adnexal, will check labs, urinalysis, urine pregnancy, will do pelvic exam and ultrasound to rule out torsion. Jaynie Crumbleatyana Navdeep Halt, PA-C 08/20 [id number]  Patient's ultrasound unremarkable, no explanation for her pain. Patient reassessed. She is completely pain free at this time. No tenderness on exam. Question ruptured ovarian cyst based on hx and exam. Pt does have many WBC on wet prep, however, exam not concerning for PID or cervicitis. She states she has  no concern for STI. GC/Chlamidia pending. Will hold off on treating at this time. Although considered diverticulitis, doubt since pain resolved and no GI symptoms. Plan to DC home with ibuprofen or tylenol as needed and follow up. Return precautions discussed.  Jaynie Crumbleatyana La Shehan, PA-C 08/20 [id number]    Vitals:   01/26/16 [id number] 01/26/16 0636 01/26/16 0645 01/26/16 0900  BP:   107/71 112/78  Pulse:   72 74  Resp:   18 17  Temp:    98.7 F (37.1 C)  SpO2: 99%  100% 100%  Weight:  59 kg    Height:  5\' 5"  (1.651 m)      Final Clinical Impressions(s) / ED Diagnoses   Final diagnoses:  Adnexal pain  Adnexal pain    New Prescriptions Discharge Medication List as of 01/26/2016  9:42 AM       Jaynie Crumbleatyana Ansar Skoda, PA-C 01/26/16 1005    Alvira MondayErin Schlossman, MD 01/30/16 1333

## 2016-01-27 LAB — GC/CHLAMYDIA PROBE AMP (~~LOC~~) NOT AT ARMC
Chlamydia: NEGATIVE
Neisseria Gonorrhea: NEGATIVE

## 2016-03-25 ENCOUNTER — Encounter (HOSPITAL_COMMUNITY): Payer: Self-pay

## 2016-03-25 ENCOUNTER — Emergency Department (HOSPITAL_COMMUNITY): Payer: BLUE CROSS/BLUE SHIELD

## 2016-03-25 ENCOUNTER — Emergency Department (HOSPITAL_COMMUNITY)
Admission: EM | Admit: 2016-03-25 | Discharge: 2016-03-25 | Disposition: A | Discharge status: Home / Self Care | Payer: BLUE CROSS/BLUE SHIELD | Attending: Emergency Medicine | Admitting: Emergency Medicine

## 2016-03-25 DIAGNOSIS — S80212A Abrasion, left knee, initial encounter: Secondary | ICD-10-CM | POA: Diagnosis not present

## 2016-03-25 DIAGNOSIS — Z23 Encounter for immunization: Secondary | ICD-10-CM | POA: Insufficient documentation

## 2016-03-25 DIAGNOSIS — S20319A Abrasion of unspecified front wall of thorax, initial encounter: Secondary | ICD-10-CM | POA: Insufficient documentation

## 2016-03-25 DIAGNOSIS — Y939 Activity, unspecified: Secondary | ICD-10-CM | POA: Insufficient documentation

## 2016-03-25 DIAGNOSIS — Y999 Unspecified external cause status: Secondary | ICD-10-CM | POA: Insufficient documentation

## 2016-03-25 DIAGNOSIS — Y92410 Unspecified street and highway as the place of occurrence of the external cause: Secondary | ICD-10-CM | POA: Diagnosis not present

## 2016-03-25 DIAGNOSIS — R0789 Other chest pain: Secondary | ICD-10-CM | POA: Diagnosis not present

## 2016-03-25 DIAGNOSIS — S8992XA Unspecified injury of left lower leg, initial encounter: Secondary | ICD-10-CM | POA: Diagnosis not present

## 2016-03-25 MED ORDER — IBUPROFEN 400 MG PO TABS
800.0000 mg | ORAL_TABLET | Freq: Once | ORAL | Status: AC
  Administered 2016-03-25: 800 mg via ORAL
  Filled 2016-03-25: qty 2

## 2016-03-25 MED ORDER — IBUPROFEN 800 MG PO TABS: 800.0000 mg | ORAL_TABLET | Freq: Three times a day (TID) | ORAL | 0 refills | Status: DC

## 2016-03-25 MED ORDER — ACETAMINOPHEN 325 MG PO TABS
650.0000 mg | ORAL_TABLET | Freq: Once | ORAL | Status: AC
  Administered 2016-03-25: 650 mg via ORAL
  Filled 2016-03-25: qty 2

## 2016-03-25 MED ORDER — TETANUS-DIPHTH-ACELL PERTUSSIS 5-2.5-18.5 LF-MCG/0.5 IM SUSP
0.5000 mL | Freq: Once | INTRAMUSCULAR | Status: AC
  Administered 2016-03-25: 0.5 mL via INTRAMUSCULAR
  Filled 2016-03-25: qty 0.5

## 2016-03-25 MED ORDER — CYCLOBENZAPRINE HCL 10 MG PO TABS: 10.0000 mg | ORAL_TABLET | Freq: Two times a day (BID) | ORAL | 0 refills | Status: DC | PRN

## 2016-03-25 NOTE — Discharge Instructions (Signed)
X-rays today are normal. Please keep your wound clean and dry. Wash with soap and water. He may take ibuprofen 3 times daily. Take Flexeril twice daily as needed for muscle stiffness. He may also take extra strength Tylenol every 6 hours. Follow-up with your primary care physician in one week. Return to the emergency department for any sudden severe chest pain, shortness of breath, abdominal pain, or any new symptoms.
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
## 2016-12-01 DIAGNOSIS — Z1159 Encounter for screening for other viral diseases: Secondary | ICD-10-CM | POA: Diagnosis not present

## 2016-12-01 DIAGNOSIS — Z1151 Encounter for screening for human papillomavirus (HPV): Secondary | ICD-10-CM | POA: Diagnosis not present

## 2016-12-01 DIAGNOSIS — Z1231 Encounter for screening mammogram for malignant neoplasm of breast: Secondary | ICD-10-CM | POA: Diagnosis not present

## 2016-12-01 DIAGNOSIS — Z114 Encounter for screening for human immunodeficiency virus [HIV]: Secondary | ICD-10-CM | POA: Diagnosis not present

## 2016-12-01 DIAGNOSIS — N760 Acute vaginitis: Secondary | ICD-10-CM | POA: Diagnosis not present

## 2016-12-01 DIAGNOSIS — Z6826 Body mass index (BMI) 26.0-26.9, adult: Secondary | ICD-10-CM | POA: Diagnosis not present

## 2016-12-01 DIAGNOSIS — Z01419 Encounter for gynecological examination (general) (routine) without abnormal findings: Secondary | ICD-10-CM | POA: Diagnosis not present

## 2016-12-01 DIAGNOSIS — Z113 Encounter for screening for infections with a predominantly sexual mode of transmission: Secondary | ICD-10-CM | POA: Diagnosis not present
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
## 2018-11-28 ENCOUNTER — Emergency Department (HOSPITAL_COMMUNITY): Payer: No Typology Code available for payment source

## 2018-11-28 ENCOUNTER — Emergency Department (HOSPITAL_COMMUNITY)
Admission: EM | Admit: 2018-11-28 | Discharge: 2018-11-28 | Disposition: A | Discharge status: Home / Self Care | Payer: No Typology Code available for payment source | Attending: Emergency Medicine | Admitting: Emergency Medicine

## 2018-11-28 ENCOUNTER — Other Ambulatory Visit: Payer: Self-pay

## 2018-11-28 ENCOUNTER — Encounter (HOSPITAL_COMMUNITY): Payer: Self-pay | Admitting: Emergency Medicine

## 2018-11-28 DIAGNOSIS — S3992XA Unspecified injury of lower back, initial encounter: Secondary | ICD-10-CM | POA: Diagnosis present

## 2018-11-28 DIAGNOSIS — S60212A Contusion of left wrist, initial encounter: Secondary | ICD-10-CM | POA: Insufficient documentation

## 2018-11-28 DIAGNOSIS — Y92410 Unspecified street and highway as the place of occurrence of the external cause: Secondary | ICD-10-CM | POA: Insufficient documentation

## 2018-11-28 DIAGNOSIS — Y999 Unspecified external cause status: Secondary | ICD-10-CM | POA: Diagnosis not present

## 2018-11-28 DIAGNOSIS — S39012A Strain of muscle, fascia and tendon of lower back, initial encounter: Secondary | ICD-10-CM | POA: Insufficient documentation

## 2018-11-28 DIAGNOSIS — Y939 Activity, unspecified: Secondary | ICD-10-CM | POA: Diagnosis not present

## 2018-11-28 MED ORDER — IBUPROFEN 600 MG PO TABS: 600.0000 mg | ORAL_TABLET | Freq: Three times a day (TID) | ORAL | 0 refills | Status: DC | PRN

## 2018-11-28 MED ORDER — CYCLOBENZAPRINE HCL 10 MG PO TABS: 10.0000 mg | ORAL_TABLET | Freq: Two times a day (BID) | ORAL | 0 refills | Status: DC | PRN

## 2018-11-28 NOTE — ED Notes (Signed)
Patient verbalizes understanding of discharge instructions. Opportunity for questioning and answers were provided. Armband removed by staff, pt discharged from ED.  
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
## 2018-11-28 NOTE — Discharge Instructions (Addendum)
Take Motrin as needed as prescribed for muscle soreness.  Take Flexeril as needed as prescribed for muscle spasm or tightness.  Do not take Flexeril if driving or operating machinery.  Apply warm compresses to sore muscles in your back for 20 minutes at a time.  Apply ice to your left wrist for 20 minutes at a time as needed. Follow-up with your primary care provider if not improving as expected, return to ER for severe or concerning symptoms.

## 2020-04-26 ENCOUNTER — Ambulatory Visit (INDEPENDENT_AMBULATORY_CARE_PROVIDER_SITE_OTHER): Payer: 59

## 2020-04-26 ENCOUNTER — Encounter: Payer: Self-pay | Admitting: Podiatry

## 2020-04-26 ENCOUNTER — Ambulatory Visit (INDEPENDENT_AMBULATORY_CARE_PROVIDER_SITE_OTHER): Payer: 59 | Admitting: Podiatry

## 2020-04-26 ENCOUNTER — Other Ambulatory Visit: Payer: Self-pay

## 2020-04-26 DIAGNOSIS — M2042 Other hammer toe(s) (acquired), left foot: Secondary | ICD-10-CM

## 2020-04-26 DIAGNOSIS — L84 Corns and callosities: Secondary | ICD-10-CM

## 2020-04-26 DIAGNOSIS — M779 Enthesopathy, unspecified: Secondary | ICD-10-CM

## 2020-04-28 NOTE — Progress Notes (Signed)
Subjective:   Patient ID: Rachael Gardner, female   DOB: 49 y.o.   MRN: [id number]   HPI Patient presents stating she has had chronic problems with her fifth toe on her left foot and states that it is been sore and she has had it worked on previously.  She would like to have it fixed so it does not come back and patient states she has tried to trim it herself and is tried padding and other modalities.  Patient does not smoke likes to be active   Review of Systems  All other systems reviewed and are negative.       Objective:  Physical Exam Vitals and nursing note reviewed.  Constitutional:      Appearance: She is well-developed.  Pulmonary:     Effort: Pulmonary effort is normal.  Musculoskeletal:        General: Normal range of motion.  Skin:    General: Skin is warm.  Neurological:     Mental Status: She is alert.     Neurovascular status was found to be intact muscle strength was found to be adequate.  Range of motion within normal limits with patient found to have a keratotic lesion digit 5 left that is painful when pressed and make shoe gear difficult.  Patient has fluid buildup around the inner phalangeal joint and it is painful when palpated     Assessment:  Inflammatory capsulitis digit 5 left with pain     Plan:  H&P reviewed condition and discussed this problem.  At this point I do think digital correction is necessary with removal of bone and skin.  I explained procedure to patient and risk and they want procedure and after extensive review signed.  To them try to give him temporary relief I went ahead and prepped the inner phalangeal joint injected with 1 mg dexamethasone 3 mg Xylocaine and patient will be seen back to recheck

## 2020-06-17 ENCOUNTER — Ambulatory Visit (INDEPENDENT_AMBULATORY_CARE_PROVIDER_SITE_OTHER): Payer: 59 | Admitting: Podiatry

## 2020-06-17 ENCOUNTER — Encounter: Payer: Self-pay | Admitting: Podiatry

## 2020-06-17 ENCOUNTER — Other Ambulatory Visit: Payer: Self-pay

## 2020-06-17 VITALS — BP 139/96 | HR 85 | Temp 97.5°F

## 2020-06-17 DIAGNOSIS — M2042 Other hammer toe(s) (acquired), left foot: Secondary | ICD-10-CM | POA: Diagnosis not present

## 2020-06-17 MED ORDER — HYDROCODONE-ACETAMINOPHEN 10-325 MG PO TABS: 1.0000 | ORAL_TABLET | Freq: Three times a day (TID) | ORAL | 0 refills | Status: AC | PRN

## 2020-06-17 NOTE — Progress Notes (Signed)
Subjective:   Patient ID: Rachael Gardner, female   DOB: 50 y.o.   MRN: [id number]   HPI Patient presents for digital correction fifth toe left foot states its been sore and she cannot wear shoes   ROS      Objective:  Physical Exam  Neurovascular status intact with severe keratotic lesion digit 5 left rotated toe with chronic pain     Assessment:  Hammertoe deformity chronic in nature fifth digit left     Plan:  Patient was anesthetized with 120 mg like Marcaine mixture taken to the OR and prep done.  Patient left foot was exsanguinated utilizing Ace wrap and tourniquet inflated to 200 mmHg and following procedure was performed.  Arthroplasty digit 5 left attention directed dorsal aspect of digit 5 left were some elliptical incision was made encompassing the severe lesion formation.  The incision was taken down to the extensor expansion and I went ahead and identified and found that it involved the entire middle phalanx as far as pathology.  Utilizing sharp and blunt dissection I was able to excise the entire middle phalanx and I then went ahead and I derotated straighten the toe and I was able to put it into good alignment.  I sutured with 4-0 nylon and sterile dressing applied tourniquet released capillary fill noted to be immediate to all digits of the left foot and patient left the OR in satisfactory condition and will be seen back in a weeks and was dispensed surgical shoe and hydrocodone for postoperative pain

## 2020-07-01 ENCOUNTER — Encounter: Payer: Self-pay | Admitting: Podiatry

## 2020-07-01 ENCOUNTER — Other Ambulatory Visit: Payer: Self-pay

## 2020-07-01 ENCOUNTER — Encounter: Payer: 59 | Admitting: Podiatrist

## 2020-07-01 ENCOUNTER — Ambulatory Visit (INDEPENDENT_AMBULATORY_CARE_PROVIDER_SITE_OTHER): Payer: 59

## 2020-07-01 ENCOUNTER — Ambulatory Visit (INDEPENDENT_AMBULATORY_CARE_PROVIDER_SITE_OTHER): Payer: 59 | Admitting: Podiatry

## 2020-07-01 ENCOUNTER — Encounter: Payer: 59 | Admitting: Podiatry

## 2020-07-01 DIAGNOSIS — M79671 Pain in right foot: Secondary | ICD-10-CM | POA: Diagnosis not present

## 2020-07-01 NOTE — Progress Notes (Signed)
Subjective:   Patient ID: Rachael Gardner, female   DOB: 49 y.o.   MRN: [id number]   HPI Patient states she is doing well with minimal pain   ROS      Objective:  Physical Exam  Neurovascular status intact and negative homan sign. Left 5th toe healing well with stitches are intact with no drainage or reddness     Assessment:  Patient doing well after having arthroplasty fifth digit left     Plan:  H&P reviewed condition and went ahead remove stitches wound edges coapted well toe in good alignment with good positional component. X-rays indicate good resection of bone no indications of pathology stitches removed patient may slowly return to soft shoe gear in the next 2 weeks and is encouraged to call with questions concerns

## 2020-07-03 ENCOUNTER — Ambulatory Visit: Payer: 59 | Admitting: Podiatry

## 2020-07-11 ENCOUNTER — Encounter: Payer: Self-pay | Admitting: Podiatry

## 2020-07-11 DIAGNOSIS — M79676 Pain in unspecified toe(s): Secondary | ICD-10-CM

## 2022-04-02 ENCOUNTER — Ambulatory Visit (HOSPITAL_COMMUNITY)
Admission: EM | Admit: 2022-04-02 | Discharge: 2022-04-02 | Disposition: A | Discharge status: Home / Self Care | Payer: Self-pay | Attending: Family Medicine | Admitting: Family Medicine

## 2022-04-02 ENCOUNTER — Encounter (HOSPITAL_COMMUNITY): Payer: Self-pay

## 2022-04-02 DIAGNOSIS — S39012A Strain of muscle, fascia and tendon of lower back, initial encounter: Secondary | ICD-10-CM

## 2022-04-02 DIAGNOSIS — M25561 Pain in right knee: Secondary | ICD-10-CM

## 2022-04-02 DIAGNOSIS — M25562 Pain in left knee: Secondary | ICD-10-CM

## 2022-04-02 MED ORDER — IBUPROFEN 800 MG PO TABS: 800.0000 mg | ORAL_TABLET | Freq: Three times a day (TID) | ORAL | 0 refills | Status: AC

## 2022-04-02 MED ORDER — METHOCARBAMOL 500 MG PO TABS: 500.0000 mg | ORAL_TABLET | Freq: Two times a day (BID) | ORAL | 0 refills | Status: AC
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
## 2022-04-02 NOTE — Discharge Instructions (Signed)
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
## 2023-02-17 ENCOUNTER — Ambulatory Visit (INDEPENDENT_AMBULATORY_CARE_PROVIDER_SITE_OTHER): Payer: Self-pay

## 2023-02-17 ENCOUNTER — Ambulatory Visit
Admission: EM | Admit: 2023-02-17 | Discharge: 2023-02-17 | Disposition: A | Discharge status: Home / Self Care | Payer: Self-pay | Attending: Internal Medicine | Admitting: Internal Medicine

## 2023-02-17 DIAGNOSIS — S90851A Superficial foreign body, right foot, initial encounter: Secondary | ICD-10-CM

## 2023-02-17 MED ORDER — CEPHALEXIN 500 MG PO CAPS: 500.0000 mg | ORAL_CAPSULE | Freq: Three times a day (TID) | ORAL | 0 refills | Status: AC

## 2023-02-17 NOTE — Discharge Instructions (Addendum)
Start Keflex 3 times a day for 5 days.  I have referred you to podiatry so they can remove the foreign body from your foot.  If you do not hear from them in 24 hours please contact their office to make a follow-up appointment as soon as possible.  If you develop any worsening symptoms before you see podiatry please go to the emergency room for further treatment.
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
## 2023-02-25 ENCOUNTER — Ambulatory Visit (INDEPENDENT_AMBULATORY_CARE_PROVIDER_SITE_OTHER): Payer: Self-pay

## 2023-02-25 ENCOUNTER — Encounter: Payer: Self-pay | Admitting: Podiatry

## 2023-02-25 ENCOUNTER — Ambulatory Visit: Payer: Self-pay

## 2023-02-25 ENCOUNTER — Ambulatory Visit (INDEPENDENT_AMBULATORY_CARE_PROVIDER_SITE_OTHER): Payer: Self-pay | Admitting: Podiatry

## 2023-02-25 VITALS — BP 117/75 | HR 82

## 2023-02-25 DIAGNOSIS — L923 Foreign body granuloma of the skin and subcutaneous tissue: Secondary | ICD-10-CM

## 2023-02-25 DIAGNOSIS — M2041 Other hammer toe(s) (acquired), right foot: Secondary | ICD-10-CM

## 2023-02-25 DIAGNOSIS — M778 Other enthesopathies, not elsewhere classified: Secondary | ICD-10-CM
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
